# Patient Record
Sex: Male | Born: 1945 | Race: White | Hispanic: No | Marital: Married | Smoking: Never smoker
Health system: Southern US, Community
[De-identification: ages and names within clinical notes are randomized; demographics above are authoritative.]

## PROBLEM LIST (undated history)

## (undated) DIAGNOSIS — K219 Gastro-esophageal reflux disease without esophagitis: Secondary | ICD-10-CM

## (undated) DIAGNOSIS — Z950 Presence of cardiac pacemaker: Secondary | ICD-10-CM

## (undated) DIAGNOSIS — J189 Pneumonia, unspecified organism: Secondary | ICD-10-CM

## (undated) DIAGNOSIS — E7800 Pure hypercholesterolemia, unspecified: Secondary | ICD-10-CM

## (undated) DIAGNOSIS — M1990 Unspecified osteoarthritis, unspecified site: Secondary | ICD-10-CM

## (undated) DIAGNOSIS — F4310 Post-traumatic stress disorder, unspecified: Secondary | ICD-10-CM

## (undated) DIAGNOSIS — Z9289 Personal history of other medical treatment: Secondary | ICD-10-CM

## (undated) DIAGNOSIS — I4891 Unspecified atrial fibrillation: Secondary | ICD-10-CM

## (undated) DIAGNOSIS — Z9889 Other specified postprocedural states: Secondary | ICD-10-CM

## (undated) DIAGNOSIS — I2510 Atherosclerotic heart disease of native coronary artery without angina pectoris: Secondary | ICD-10-CM

## (undated) DIAGNOSIS — I499 Cardiac arrhythmia, unspecified: Secondary | ICD-10-CM

## (undated) DIAGNOSIS — R7303 Prediabetes: Secondary | ICD-10-CM

## (undated) DIAGNOSIS — Z95810 Presence of automatic (implantable) cardiac defibrillator: Secondary | ICD-10-CM

## (undated) DIAGNOSIS — I10 Essential (primary) hypertension: Secondary | ICD-10-CM

## (undated) DIAGNOSIS — I509 Heart failure, unspecified: Secondary | ICD-10-CM

## (undated) HISTORY — PX: CARDIAC DEFIBRILLATOR PLACEMENT: SHX171

## (undated) HISTORY — PX: PACEMAKER IMPLANT: EP1218

---

## 2008-07-04 ENCOUNTER — Ambulatory Visit: Payer: Self-pay | Admitting: Cardiology

## 2008-09-25 ENCOUNTER — Emergency Department: Payer: Self-pay | Admitting: Emergency Medicine

## 2011-08-28 DIAGNOSIS — I495 Sick sinus syndrome: Secondary | ICD-10-CM | POA: Diagnosis not present

## 2011-10-29 DIAGNOSIS — B029 Zoster without complications: Secondary | ICD-10-CM | POA: Diagnosis not present

## 2011-10-29 DIAGNOSIS — R109 Unspecified abdominal pain: Secondary | ICD-10-CM | POA: Diagnosis not present

## 2011-10-29 DIAGNOSIS — M549 Dorsalgia, unspecified: Secondary | ICD-10-CM | POA: Diagnosis not present

## 2011-10-29 DIAGNOSIS — R1084 Generalized abdominal pain: Secondary | ICD-10-CM | POA: Diagnosis not present

## 2012-02-16 DIAGNOSIS — I495 Sick sinus syndrome: Secondary | ICD-10-CM | POA: Diagnosis not present

## 2012-03-02 DIAGNOSIS — Z23 Encounter for immunization: Secondary | ICD-10-CM | POA: Diagnosis not present

## 2012-05-04 DIAGNOSIS — I421 Obstructive hypertrophic cardiomyopathy: Secondary | ICD-10-CM | POA: Diagnosis not present

## 2012-05-04 DIAGNOSIS — I2510 Atherosclerotic heart disease of native coronary artery without angina pectoris: Secondary | ICD-10-CM | POA: Diagnosis not present

## 2012-05-18 DIAGNOSIS — I495 Sick sinus syndrome: Secondary | ICD-10-CM | POA: Diagnosis not present

## 2012-07-09 DIAGNOSIS — E785 Hyperlipidemia, unspecified: Secondary | ICD-10-CM | POA: Diagnosis not present

## 2012-07-09 DIAGNOSIS — K219 Gastro-esophageal reflux disease without esophagitis: Secondary | ICD-10-CM | POA: Diagnosis not present

## 2012-07-09 DIAGNOSIS — F411 Generalized anxiety disorder: Secondary | ICD-10-CM | POA: Diagnosis not present

## 2012-08-16 DIAGNOSIS — I495 Sick sinus syndrome: Secondary | ICD-10-CM | POA: Diagnosis not present

## 2012-08-18 DIAGNOSIS — Z7901 Long term (current) use of anticoagulants: Secondary | ICD-10-CM | POA: Diagnosis not present

## 2012-08-18 DIAGNOSIS — I421 Obstructive hypertrophic cardiomyopathy: Secondary | ICD-10-CM | POA: Diagnosis not present

## 2012-08-18 DIAGNOSIS — I495 Sick sinus syndrome: Secondary | ICD-10-CM | POA: Diagnosis not present

## 2012-08-18 DIAGNOSIS — Z45018 Encounter for adjustment and management of other part of cardiac pacemaker: Secondary | ICD-10-CM | POA: Diagnosis not present

## 2012-08-18 DIAGNOSIS — I4891 Unspecified atrial fibrillation: Secondary | ICD-10-CM | POA: Diagnosis not present

## 2012-09-02 DIAGNOSIS — Z45018 Encounter for adjustment and management of other part of cardiac pacemaker: Secondary | ICD-10-CM | POA: Diagnosis not present

## 2012-09-02 DIAGNOSIS — I495 Sick sinus syndrome: Secondary | ICD-10-CM | POA: Diagnosis not present

## 2012-09-21 DIAGNOSIS — I4891 Unspecified atrial fibrillation: Secondary | ICD-10-CM | POA: Diagnosis not present

## 2012-09-21 DIAGNOSIS — K219 Gastro-esophageal reflux disease without esophagitis: Secondary | ICD-10-CM | POA: Diagnosis not present

## 2012-09-21 DIAGNOSIS — F411 Generalized anxiety disorder: Secondary | ICD-10-CM | POA: Diagnosis not present

## 2012-09-21 DIAGNOSIS — E785 Hyperlipidemia, unspecified: Secondary | ICD-10-CM | POA: Diagnosis not present

## 2012-09-21 DIAGNOSIS — R5381 Other malaise: Secondary | ICD-10-CM | POA: Diagnosis not present

## 2012-09-22 DIAGNOSIS — E785 Hyperlipidemia, unspecified: Secondary | ICD-10-CM | POA: Diagnosis not present

## 2012-09-22 DIAGNOSIS — I4891 Unspecified atrial fibrillation: Secondary | ICD-10-CM | POA: Diagnosis not present

## 2012-09-22 DIAGNOSIS — K219 Gastro-esophageal reflux disease without esophagitis: Secondary | ICD-10-CM | POA: Diagnosis not present

## 2012-09-22 DIAGNOSIS — F411 Generalized anxiety disorder: Secondary | ICD-10-CM | POA: Diagnosis not present

## 2012-09-22 DIAGNOSIS — R5383 Other fatigue: Secondary | ICD-10-CM | POA: Diagnosis not present

## 2012-09-24 DIAGNOSIS — I4891 Unspecified atrial fibrillation: Secondary | ICD-10-CM | POA: Diagnosis not present

## 2012-09-24 DIAGNOSIS — R079 Chest pain, unspecified: Secondary | ICD-10-CM | POA: Diagnosis not present

## 2012-09-24 DIAGNOSIS — I421 Obstructive hypertrophic cardiomyopathy: Secondary | ICD-10-CM | POA: Diagnosis not present

## 2012-09-24 DIAGNOSIS — I10 Essential (primary) hypertension: Secondary | ICD-10-CM | POA: Diagnosis not present

## 2012-09-24 DIAGNOSIS — Z45018 Encounter for adjustment and management of other part of cardiac pacemaker: Secondary | ICD-10-CM | POA: Diagnosis not present

## 2012-10-07 DIAGNOSIS — Z45018 Encounter for adjustment and management of other part of cardiac pacemaker: Secondary | ICD-10-CM | POA: Diagnosis not present

## 2012-10-07 DIAGNOSIS — I10 Essential (primary) hypertension: Secondary | ICD-10-CM | POA: Diagnosis not present

## 2012-10-07 DIAGNOSIS — I4891 Unspecified atrial fibrillation: Secondary | ICD-10-CM | POA: Diagnosis not present

## 2012-10-07 DIAGNOSIS — I421 Obstructive hypertrophic cardiomyopathy: Secondary | ICD-10-CM | POA: Diagnosis not present

## 2012-10-07 DIAGNOSIS — R079 Chest pain, unspecified: Secondary | ICD-10-CM | POA: Diagnosis not present

## 2012-10-13 DIAGNOSIS — Z950 Presence of cardiac pacemaker: Secondary | ICD-10-CM | POA: Diagnosis not present

## 2012-10-13 DIAGNOSIS — I421 Obstructive hypertrophic cardiomyopathy: Secondary | ICD-10-CM | POA: Diagnosis not present

## 2012-10-13 DIAGNOSIS — Z880 Allergy status to penicillin: Secondary | ICD-10-CM | POA: Diagnosis not present

## 2012-10-13 DIAGNOSIS — Z01818 Encounter for other preprocedural examination: Secondary | ICD-10-CM | POA: Diagnosis not present

## 2012-10-13 DIAGNOSIS — E785 Hyperlipidemia, unspecified: Secondary | ICD-10-CM | POA: Diagnosis not present

## 2012-10-13 DIAGNOSIS — I4891 Unspecified atrial fibrillation: Secondary | ICD-10-CM | POA: Diagnosis not present

## 2012-10-13 DIAGNOSIS — R918 Other nonspecific abnormal finding of lung field: Secondary | ICD-10-CM | POA: Diagnosis not present

## 2012-10-13 DIAGNOSIS — Z8249 Family history of ischemic heart disease and other diseases of the circulatory system: Secondary | ICD-10-CM | POA: Diagnosis not present

## 2012-10-13 DIAGNOSIS — I4510 Unspecified right bundle-branch block: Secondary | ICD-10-CM | POA: Diagnosis not present

## 2012-10-13 DIAGNOSIS — I252 Old myocardial infarction: Secondary | ICD-10-CM | POA: Diagnosis not present

## 2012-10-13 DIAGNOSIS — Z7901 Long term (current) use of anticoagulants: Secondary | ICD-10-CM | POA: Diagnosis not present

## 2012-10-13 DIAGNOSIS — I10 Essential (primary) hypertension: Secondary | ICD-10-CM | POA: Diagnosis not present

## 2012-10-13 DIAGNOSIS — R079 Chest pain, unspecified: Secondary | ICD-10-CM | POA: Diagnosis not present

## 2012-10-13 DIAGNOSIS — I059 Rheumatic mitral valve disease, unspecified: Secondary | ICD-10-CM | POA: Diagnosis not present

## 2012-10-13 DIAGNOSIS — Z79899 Other long term (current) drug therapy: Secondary | ICD-10-CM | POA: Diagnosis not present

## 2013-01-24 DIAGNOSIS — I4891 Unspecified atrial fibrillation: Secondary | ICD-10-CM | POA: Diagnosis not present

## 2013-01-24 DIAGNOSIS — R5381 Other malaise: Secondary | ICD-10-CM | POA: Diagnosis not present

## 2013-01-24 DIAGNOSIS — E785 Hyperlipidemia, unspecified: Secondary | ICD-10-CM | POA: Diagnosis not present

## 2013-01-24 DIAGNOSIS — K219 Gastro-esophageal reflux disease without esophagitis: Secondary | ICD-10-CM | POA: Diagnosis not present

## 2013-01-24 DIAGNOSIS — F411 Generalized anxiety disorder: Secondary | ICD-10-CM | POA: Diagnosis not present

## 2013-01-31 DIAGNOSIS — E785 Hyperlipidemia, unspecified: Secondary | ICD-10-CM | POA: Diagnosis not present

## 2013-01-31 DIAGNOSIS — K219 Gastro-esophageal reflux disease without esophagitis: Secondary | ICD-10-CM | POA: Diagnosis not present

## 2013-01-31 DIAGNOSIS — D485 Neoplasm of uncertain behavior of skin: Secondary | ICD-10-CM | POA: Diagnosis not present

## 2013-01-31 DIAGNOSIS — F411 Generalized anxiety disorder: Secondary | ICD-10-CM | POA: Diagnosis not present

## 2013-01-31 DIAGNOSIS — I4891 Unspecified atrial fibrillation: Secondary | ICD-10-CM | POA: Diagnosis not present

## 2013-01-31 DIAGNOSIS — Z23 Encounter for immunization: Secondary | ICD-10-CM | POA: Diagnosis not present

## 2013-01-31 DIAGNOSIS — R5381 Other malaise: Secondary | ICD-10-CM | POA: Diagnosis not present

## 2013-02-02 DIAGNOSIS — L821 Other seborrheic keratosis: Secondary | ICD-10-CM | POA: Diagnosis not present

## 2013-02-02 DIAGNOSIS — D485 Neoplasm of uncertain behavior of skin: Secondary | ICD-10-CM | POA: Diagnosis not present

## 2013-02-02 DIAGNOSIS — L570 Actinic keratosis: Secondary | ICD-10-CM | POA: Diagnosis not present

## 2013-04-06 DIAGNOSIS — Z45018 Encounter for adjustment and management of other part of cardiac pacemaker: Secondary | ICD-10-CM | POA: Diagnosis not present

## 2013-04-06 DIAGNOSIS — I421 Obstructive hypertrophic cardiomyopathy: Secondary | ICD-10-CM | POA: Diagnosis not present

## 2013-04-11 DIAGNOSIS — R55 Syncope and collapse: Secondary | ICD-10-CM | POA: Diagnosis not present

## 2013-04-13 DIAGNOSIS — I472 Ventricular tachycardia: Secondary | ICD-10-CM | POA: Diagnosis not present

## 2013-04-13 DIAGNOSIS — R42 Dizziness and giddiness: Secondary | ICD-10-CM | POA: Diagnosis not present

## 2013-04-13 DIAGNOSIS — R55 Syncope and collapse: Secondary | ICD-10-CM | POA: Diagnosis not present

## 2013-04-19 DIAGNOSIS — I472 Ventricular tachycardia: Secondary | ICD-10-CM | POA: Diagnosis not present

## 2013-04-19 DIAGNOSIS — R42 Dizziness and giddiness: Secondary | ICD-10-CM | POA: Diagnosis not present

## 2013-04-19 DIAGNOSIS — R55 Syncope and collapse: Secondary | ICD-10-CM | POA: Diagnosis not present

## 2013-04-27 DIAGNOSIS — I421 Obstructive hypertrophic cardiomyopathy: Secondary | ICD-10-CM | POA: Diagnosis not present

## 2013-04-27 DIAGNOSIS — I10 Essential (primary) hypertension: Secondary | ICD-10-CM | POA: Diagnosis not present

## 2013-04-27 DIAGNOSIS — I4891 Unspecified atrial fibrillation: Secondary | ICD-10-CM | POA: Diagnosis not present

## 2013-04-27 DIAGNOSIS — I2510 Atherosclerotic heart disease of native coronary artery without angina pectoris: Secondary | ICD-10-CM | POA: Diagnosis not present

## 2013-04-27 DIAGNOSIS — I472 Ventricular tachycardia: Secondary | ICD-10-CM | POA: Diagnosis not present

## 2013-05-27 DIAGNOSIS — I479 Paroxysmal tachycardia, unspecified: Secondary | ICD-10-CM | POA: Diagnosis not present

## 2013-05-27 DIAGNOSIS — I10 Essential (primary) hypertension: Secondary | ICD-10-CM | POA: Diagnosis not present

## 2013-05-27 DIAGNOSIS — I4729 Other ventricular tachycardia: Secondary | ICD-10-CM | POA: Diagnosis not present

## 2013-05-27 DIAGNOSIS — I421 Obstructive hypertrophic cardiomyopathy: Secondary | ICD-10-CM | POA: Diagnosis not present

## 2013-05-27 DIAGNOSIS — Z45018 Encounter for adjustment and management of other part of cardiac pacemaker: Secondary | ICD-10-CM | POA: Diagnosis not present

## 2013-05-27 DIAGNOSIS — Z7901 Long term (current) use of anticoagulants: Secondary | ICD-10-CM | POA: Diagnosis not present

## 2013-05-27 DIAGNOSIS — I472 Ventricular tachycardia, unspecified: Secondary | ICD-10-CM | POA: Diagnosis not present

## 2013-05-27 DIAGNOSIS — I4891 Unspecified atrial fibrillation: Secondary | ICD-10-CM | POA: Diagnosis not present

## 2013-05-28 DIAGNOSIS — I4891 Unspecified atrial fibrillation: Secondary | ICD-10-CM | POA: Diagnosis not present

## 2013-05-28 DIAGNOSIS — J984 Other disorders of lung: Secondary | ICD-10-CM | POA: Diagnosis not present

## 2013-05-28 DIAGNOSIS — Z7901 Long term (current) use of anticoagulants: Secondary | ICD-10-CM | POA: Diagnosis not present

## 2013-05-28 DIAGNOSIS — I4729 Other ventricular tachycardia: Secondary | ICD-10-CM | POA: Diagnosis not present

## 2013-05-28 DIAGNOSIS — Z45018 Encounter for adjustment and management of other part of cardiac pacemaker: Secondary | ICD-10-CM | POA: Diagnosis not present

## 2013-05-28 DIAGNOSIS — I421 Obstructive hypertrophic cardiomyopathy: Secondary | ICD-10-CM | POA: Diagnosis not present

## 2013-05-28 DIAGNOSIS — I472 Ventricular tachycardia: Secondary | ICD-10-CM | POA: Diagnosis not present

## 2013-05-28 DIAGNOSIS — I10 Essential (primary) hypertension: Secondary | ICD-10-CM | POA: Diagnosis not present

## 2013-05-30 DIAGNOSIS — K219 Gastro-esophageal reflux disease without esophagitis: Secondary | ICD-10-CM | POA: Diagnosis not present

## 2013-05-30 DIAGNOSIS — E785 Hyperlipidemia, unspecified: Secondary | ICD-10-CM | POA: Diagnosis not present

## 2013-05-30 DIAGNOSIS — I4891 Unspecified atrial fibrillation: Secondary | ICD-10-CM | POA: Diagnosis not present

## 2013-06-06 DIAGNOSIS — F411 Generalized anxiety disorder: Secondary | ICD-10-CM | POA: Diagnosis not present

## 2013-06-06 DIAGNOSIS — R5381 Other malaise: Secondary | ICD-10-CM | POA: Diagnosis not present

## 2013-06-06 DIAGNOSIS — R5383 Other fatigue: Secondary | ICD-10-CM | POA: Diagnosis not present

## 2013-06-06 DIAGNOSIS — I4891 Unspecified atrial fibrillation: Secondary | ICD-10-CM | POA: Diagnosis not present

## 2013-06-06 DIAGNOSIS — K219 Gastro-esophageal reflux disease without esophagitis: Secondary | ICD-10-CM | POA: Diagnosis not present

## 2013-06-06 DIAGNOSIS — F321 Major depressive disorder, single episode, moderate: Secondary | ICD-10-CM | POA: Diagnosis not present

## 2013-06-06 DIAGNOSIS — Z23 Encounter for immunization: Secondary | ICD-10-CM | POA: Diagnosis not present

## 2013-06-06 DIAGNOSIS — E785 Hyperlipidemia, unspecified: Secondary | ICD-10-CM | POA: Diagnosis not present

## 2013-08-11 DIAGNOSIS — Z4502 Encounter for adjustment and management of automatic implantable cardiac defibrillator: Secondary | ICD-10-CM | POA: Diagnosis not present

## 2013-08-11 DIAGNOSIS — I472 Ventricular tachycardia: Secondary | ICD-10-CM | POA: Diagnosis not present

## 2013-08-11 DIAGNOSIS — I4729 Other ventricular tachycardia: Secondary | ICD-10-CM | POA: Diagnosis not present

## 2013-08-11 DIAGNOSIS — I421 Obstructive hypertrophic cardiomyopathy: Secondary | ICD-10-CM | POA: Diagnosis not present

## 2013-09-15 DIAGNOSIS — R5381 Other malaise: Secondary | ICD-10-CM | POA: Diagnosis not present

## 2013-09-15 DIAGNOSIS — E785 Hyperlipidemia, unspecified: Secondary | ICD-10-CM | POA: Diagnosis not present

## 2013-09-23 DIAGNOSIS — K625 Hemorrhage of anus and rectum: Secondary | ICD-10-CM | POA: Diagnosis not present

## 2013-09-23 DIAGNOSIS — R310 Gross hematuria: Secondary | ICD-10-CM | POA: Diagnosis not present

## 2013-09-23 DIAGNOSIS — F411 Generalized anxiety disorder: Secondary | ICD-10-CM | POA: Diagnosis not present

## 2013-09-23 DIAGNOSIS — K219 Gastro-esophageal reflux disease without esophagitis: Secondary | ICD-10-CM | POA: Diagnosis not present

## 2013-09-23 DIAGNOSIS — R5381 Other malaise: Secondary | ICD-10-CM | POA: Diagnosis not present

## 2013-09-23 DIAGNOSIS — E785 Hyperlipidemia, unspecified: Secondary | ICD-10-CM | POA: Diagnosis not present

## 2013-09-23 DIAGNOSIS — F321 Major depressive disorder, single episode, moderate: Secondary | ICD-10-CM | POA: Diagnosis not present

## 2013-09-23 DIAGNOSIS — I4891 Unspecified atrial fibrillation: Secondary | ICD-10-CM | POA: Diagnosis not present

## 2013-10-14 DIAGNOSIS — K602 Anal fissure, unspecified: Secondary | ICD-10-CM | POA: Diagnosis not present

## 2013-10-14 DIAGNOSIS — I4891 Unspecified atrial fibrillation: Secondary | ICD-10-CM | POA: Diagnosis not present

## 2013-11-02 DIAGNOSIS — I209 Angina pectoris, unspecified: Secondary | ICD-10-CM | POA: Diagnosis not present

## 2013-11-02 DIAGNOSIS — I421 Obstructive hypertrophic cardiomyopathy: Secondary | ICD-10-CM | POA: Diagnosis not present

## 2013-11-02 DIAGNOSIS — I10 Essential (primary) hypertension: Secondary | ICD-10-CM | POA: Diagnosis not present

## 2013-11-02 DIAGNOSIS — I4891 Unspecified atrial fibrillation: Secondary | ICD-10-CM | POA: Diagnosis not present

## 2013-11-18 ENCOUNTER — Encounter (HOSPITAL_COMMUNITY): Payer: Self-pay | Admitting: Emergency Medicine

## 2013-11-18 ENCOUNTER — Emergency Department (HOSPITAL_COMMUNITY)
Admission: EM | Admit: 2013-11-18 | Discharge: 2013-11-18 | Disposition: A | Discharge status: Home / Self Care | Payer: Medicare Other | Attending: Emergency Medicine | Admitting: Emergency Medicine

## 2013-11-18 DIAGNOSIS — I2510 Atherosclerotic heart disease of native coronary artery without angina pectoris: Secondary | ICD-10-CM | POA: Insufficient documentation

## 2013-11-18 DIAGNOSIS — S60222S Contusion of left hand, sequela: Secondary | ICD-10-CM

## 2013-11-18 DIAGNOSIS — I209 Angina pectoris, unspecified: Secondary | ICD-10-CM | POA: Diagnosis not present

## 2013-11-18 DIAGNOSIS — Z880 Allergy status to penicillin: Secondary | ICD-10-CM | POA: Diagnosis not present

## 2013-11-18 DIAGNOSIS — Z9889 Other specified postprocedural states: Secondary | ICD-10-CM

## 2013-11-18 DIAGNOSIS — S60229A Contusion of unspecified hand, initial encounter: Secondary | ICD-10-CM | POA: Diagnosis not present

## 2013-11-18 DIAGNOSIS — M7989 Other specified soft tissue disorders: Secondary | ICD-10-CM | POA: Insufficient documentation

## 2013-11-18 HISTORY — DX: Other specified postprocedural states: Z98.890

## 2013-11-18 HISTORY — DX: Atherosclerotic heart disease of native coronary artery without angina pectoris: I25.10

## 2013-11-18 NOTE — Discharge Instructions (Signed)
Keep hand elevated. Use cold compresses.  Follow up as needed

## 2013-11-18 NOTE — ED Notes (Signed)
There was an IV in my left hand and after I took the bandage off it started swelling per pt.

## 2013-11-18 NOTE — ED Provider Notes (Signed)
CSN: [id number]     Arrival date & time 11/18/13  2142 History   First MD Initiated Contact with Patient 11/18/13 2201    This chart was scribed for Maudry Diego, MD by Terressa Koyanagi, ED Scribe. This patient was seen in room APA08/APA08 and the patient's care was started at 10:03 PM.  Chief Complaint  Patient presents with  . Hand Problem   Patient is a 68 y.o. male presenting with general illness. The history is provided by the patient. No language interpreter was used.  Illness Location:  Swelling to left hand  Severity:  Mild Onset quality:  Sudden Timing:  Constant Progression:  Unchanged Chronicity:  New Context:  Swelling at IV placement site  Associated symptoms: no abdominal pain, no chest pain, no congestion, no cough, no diarrhea, no fatigue, no headaches and no rash    HPI Comments: Walter Raymond is a 68 y.o. male who presents to the Emergency Department complaining of swelling to the left hand in the IV site. Pt reports that he had an IV placement in the same area and when he took the bandage off the area began to swell. Pt denies taking blood thinners including aspirin.    Past Medical History  Diagnosis Date  . S/P cardiac cath 11/18/2013  . Coronary artery disease    History reviewed. No pertinent past surgical history. No family history on file. History  Substance Use Topics  . Smoking status: Never Smoker   . Smokeless tobacco: Not on file  . Alcohol Use: Yes    Review of Systems  Constitutional: Negative for appetite change and fatigue.  HENT: Negative for congestion, ear discharge and sinus pressure.   Eyes: Negative for discharge.  Respiratory: Negative for cough.   Cardiovascular: Negative for chest pain.  Gastrointestinal: Negative for abdominal pain and diarrhea.  Genitourinary: Negative for frequency and hematuria.  Musculoskeletal: Negative for back pain.  Skin: Negative for rash.       Swelling to left hand   Neurological: Negative for  seizures and headaches.  Psychiatric/Behavioral: Negative for hallucinations.      Allergies  Penicillins  Home Medications   Prior to Admission medications   Not on File   Triage Vitals: BP 152/102  Pulse 85  Temp(Src) 98 F (36.7 C) (Oral)  Resp 16  Ht 5\' 8"  (1.727 m)  Wt 183 lb (83.008 kg)  BMI 27.83 kg/m2  SpO2 98% Physical Exam  Constitutional: He is oriented to person, place, and time. He appears well-developed.  HENT:  Head: Normocephalic.  Eyes: Conjunctivae are normal.  Neck: No tracheal deviation present.  Cardiovascular:  No murmur heard. Musculoskeletal: Normal range of motion. He exhibits tenderness (mild tenderness to left hand ).  Neurological: He is oriented to person, place, and time.  Skin: Skin is warm.  Mild bruising to posterior left hand, 2cm indurated  Psychiatric: He has a normal mood and affect.    ED Course  Procedures (including critical care time) DIAGNOSTIC STUDIES: Oxygen Saturation is 98% on RA, normal by my interpretation.    COORDINATION OF CARE: 10:07 PM-Discussed treatment plan which includes icing the affected area with pt at bedside and pt agreed to plan.    Labs Review Labs Reviewed - No data to display  Imaging Review No results found.   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  Cold compresses    Maudry Diego, MD  11/18/13 2255 

## 2013-11-18 NOTE — ED Notes (Signed)
Patient states he has a pacer/defib. Patient's heart rate dropped down to 45 during triage.

## 2013-12-12 DIAGNOSIS — Z4502 Encounter for adjustment and management of automatic implantable cardiac defibrillator: Secondary | ICD-10-CM | POA: Diagnosis not present

## 2013-12-12 DIAGNOSIS — I421 Obstructive hypertrophic cardiomyopathy: Secondary | ICD-10-CM | POA: Diagnosis not present

## 2013-12-13 DIAGNOSIS — I421 Obstructive hypertrophic cardiomyopathy: Secondary | ICD-10-CM | POA: Diagnosis not present

## 2013-12-13 DIAGNOSIS — I4891 Unspecified atrial fibrillation: Secondary | ICD-10-CM | POA: Diagnosis not present

## 2013-12-13 DIAGNOSIS — I10 Essential (primary) hypertension: Secondary | ICD-10-CM | POA: Diagnosis not present

## 2013-12-13 DIAGNOSIS — R0602 Shortness of breath: Secondary | ICD-10-CM | POA: Diagnosis not present

## 2013-12-13 DIAGNOSIS — I472 Ventricular tachycardia: Secondary | ICD-10-CM | POA: Diagnosis not present

## 2013-12-13 DIAGNOSIS — I209 Angina pectoris, unspecified: Secondary | ICD-10-CM | POA: Diagnosis not present

## 2013-12-13 DIAGNOSIS — I4729 Other ventricular tachycardia: Secondary | ICD-10-CM | POA: Diagnosis not present

## 2013-12-15 DIAGNOSIS — F3289 Other specified depressive episodes: Secondary | ICD-10-CM | POA: Diagnosis not present

## 2013-12-15 DIAGNOSIS — F329 Major depressive disorder, single episode, unspecified: Secondary | ICD-10-CM | POA: Diagnosis not present

## 2013-12-15 DIAGNOSIS — F411 Generalized anxiety disorder: Secondary | ICD-10-CM | POA: Diagnosis not present

## 2013-12-15 DIAGNOSIS — Z1211 Encounter for screening for malignant neoplasm of colon: Secondary | ICD-10-CM | POA: Diagnosis not present

## 2013-12-15 DIAGNOSIS — Z880 Allergy status to penicillin: Secondary | ICD-10-CM | POA: Diagnosis not present

## 2013-12-15 DIAGNOSIS — Z95810 Presence of automatic (implantable) cardiac defibrillator: Secondary | ICD-10-CM | POA: Diagnosis not present

## 2013-12-15 DIAGNOSIS — K602 Anal fissure, unspecified: Secondary | ICD-10-CM | POA: Diagnosis not present

## 2013-12-15 DIAGNOSIS — K5730 Diverticulosis of large intestine without perforation or abscess without bleeding: Secondary | ICD-10-CM | POA: Diagnosis not present

## 2013-12-15 DIAGNOSIS — Z79899 Other long term (current) drug therapy: Secondary | ICD-10-CM | POA: Diagnosis not present

## 2013-12-15 DIAGNOSIS — E785 Hyperlipidemia, unspecified: Secondary | ICD-10-CM | POA: Diagnosis not present

## 2013-12-15 DIAGNOSIS — I2510 Atherosclerotic heart disease of native coronary artery without angina pectoris: Secondary | ICD-10-CM | POA: Diagnosis not present

## 2013-12-15 DIAGNOSIS — M129 Arthropathy, unspecified: Secondary | ICD-10-CM | POA: Diagnosis not present

## 2013-12-15 DIAGNOSIS — Z7901 Long term (current) use of anticoagulants: Secondary | ICD-10-CM | POA: Diagnosis not present

## 2013-12-15 DIAGNOSIS — R011 Cardiac murmur, unspecified: Secondary | ICD-10-CM | POA: Diagnosis not present

## 2013-12-15 DIAGNOSIS — Z791 Long term (current) use of non-steroidal anti-inflammatories (NSAID): Secondary | ICD-10-CM | POA: Diagnosis not present

## 2013-12-15 DIAGNOSIS — K219 Gastro-esophageal reflux disease without esophagitis: Secondary | ICD-10-CM | POA: Diagnosis not present

## 2013-12-19 DIAGNOSIS — K219 Gastro-esophageal reflux disease without esophagitis: Secondary | ICD-10-CM | POA: Diagnosis not present

## 2014-01-10 DIAGNOSIS — F411 Generalized anxiety disorder: Secondary | ICD-10-CM | POA: Diagnosis not present

## 2014-01-10 DIAGNOSIS — K449 Diaphragmatic hernia without obstruction or gangrene: Secondary | ICD-10-CM | POA: Diagnosis not present

## 2014-01-10 DIAGNOSIS — Z880 Allergy status to penicillin: Secondary | ICD-10-CM | POA: Diagnosis not present

## 2014-01-10 DIAGNOSIS — Z800 Family history of malignant neoplasm of digestive organs: Secondary | ICD-10-CM | POA: Diagnosis not present

## 2014-01-10 DIAGNOSIS — K219 Gastro-esophageal reflux disease without esophagitis: Secondary | ICD-10-CM | POA: Diagnosis not present

## 2014-01-10 DIAGNOSIS — F3289 Other specified depressive episodes: Secondary | ICD-10-CM | POA: Diagnosis not present

## 2014-01-10 DIAGNOSIS — E785 Hyperlipidemia, unspecified: Secondary | ICD-10-CM | POA: Diagnosis not present

## 2014-01-10 DIAGNOSIS — Z8249 Family history of ischemic heart disease and other diseases of the circulatory system: Secondary | ICD-10-CM | POA: Diagnosis not present

## 2014-01-10 DIAGNOSIS — M129 Arthropathy, unspecified: Secondary | ICD-10-CM | POA: Diagnosis not present

## 2014-01-10 DIAGNOSIS — Z836 Family history of other diseases of the respiratory system: Secondary | ICD-10-CM | POA: Diagnosis not present

## 2014-01-10 DIAGNOSIS — Z79899 Other long term (current) drug therapy: Secondary | ICD-10-CM | POA: Diagnosis not present

## 2014-01-10 DIAGNOSIS — R1310 Dysphagia, unspecified: Secondary | ICD-10-CM | POA: Diagnosis not present

## 2014-01-10 DIAGNOSIS — F329 Major depressive disorder, single episode, unspecified: Secondary | ICD-10-CM | POA: Diagnosis not present

## 2014-01-10 DIAGNOSIS — I2510 Atherosclerotic heart disease of native coronary artery without angina pectoris: Secondary | ICD-10-CM | POA: Diagnosis not present

## 2014-01-10 DIAGNOSIS — K2970 Gastritis, unspecified, without bleeding: Secondary | ICD-10-CM | POA: Diagnosis not present

## 2014-01-10 DIAGNOSIS — K2940 Chronic atrophic gastritis without bleeding: Secondary | ICD-10-CM | POA: Diagnosis not present

## 2014-01-10 DIAGNOSIS — I4891 Unspecified atrial fibrillation: Secondary | ICD-10-CM | POA: Diagnosis not present

## 2014-01-10 DIAGNOSIS — R011 Cardiac murmur, unspecified: Secondary | ICD-10-CM | POA: Diagnosis not present

## 2014-01-10 DIAGNOSIS — K2990 Gastroduodenitis, unspecified, without bleeding: Secondary | ICD-10-CM | POA: Diagnosis not present

## 2014-01-10 DIAGNOSIS — Z95810 Presence of automatic (implantable) cardiac defibrillator: Secondary | ICD-10-CM | POA: Diagnosis not present

## 2014-01-20 DIAGNOSIS — E785 Hyperlipidemia, unspecified: Secondary | ICD-10-CM | POA: Diagnosis not present

## 2014-01-20 DIAGNOSIS — I4891 Unspecified atrial fibrillation: Secondary | ICD-10-CM | POA: Diagnosis not present

## 2014-01-20 DIAGNOSIS — R5381 Other malaise: Secondary | ICD-10-CM | POA: Diagnosis not present

## 2014-01-27 DIAGNOSIS — R634 Abnormal weight loss: Secondary | ICD-10-CM | POA: Diagnosis not present

## 2014-01-27 DIAGNOSIS — F321 Major depressive disorder, single episode, moderate: Secondary | ICD-10-CM | POA: Diagnosis not present

## 2014-01-27 DIAGNOSIS — K219 Gastro-esophageal reflux disease without esophagitis: Secondary | ICD-10-CM | POA: Diagnosis not present

## 2014-01-27 DIAGNOSIS — I4891 Unspecified atrial fibrillation: Secondary | ICD-10-CM | POA: Diagnosis not present

## 2014-01-27 DIAGNOSIS — R5381 Other malaise: Secondary | ICD-10-CM | POA: Diagnosis not present

## 2014-01-27 DIAGNOSIS — Z23 Encounter for immunization: Secondary | ICD-10-CM | POA: Diagnosis not present

## 2014-01-27 DIAGNOSIS — F411 Generalized anxiety disorder: Secondary | ICD-10-CM | POA: Diagnosis not present

## 2014-01-27 DIAGNOSIS — R5383 Other fatigue: Secondary | ICD-10-CM | POA: Diagnosis not present

## 2014-01-27 DIAGNOSIS — E785 Hyperlipidemia, unspecified: Secondary | ICD-10-CM | POA: Diagnosis not present

## 2014-02-04 DIAGNOSIS — N400 Enlarged prostate without lower urinary tract symptoms: Secondary | ICD-10-CM | POA: Diagnosis not present

## 2014-02-09 DIAGNOSIS — I472 Ventricular tachycardia: Secondary | ICD-10-CM | POA: Diagnosis not present

## 2014-02-09 DIAGNOSIS — I480 Paroxysmal atrial fibrillation: Secondary | ICD-10-CM | POA: Diagnosis not present

## 2014-02-09 DIAGNOSIS — I421 Obstructive hypertrophic cardiomyopathy: Secondary | ICD-10-CM | POA: Diagnosis not present

## 2014-02-09 DIAGNOSIS — I10 Essential (primary) hypertension: Secondary | ICD-10-CM | POA: Diagnosis not present

## 2014-02-09 DIAGNOSIS — I208 Other forms of angina pectoris: Secondary | ICD-10-CM | POA: Diagnosis not present

## 2014-02-09 DIAGNOSIS — K222 Esophageal obstruction: Secondary | ICD-10-CM | POA: Diagnosis not present

## 2014-02-20 DIAGNOSIS — R109 Unspecified abdominal pain: Secondary | ICD-10-CM | POA: Diagnosis not present

## 2014-02-20 DIAGNOSIS — I208 Other forms of angina pectoris: Secondary | ICD-10-CM | POA: Diagnosis not present

## 2014-03-08 DIAGNOSIS — N200 Calculus of kidney: Secondary | ICD-10-CM | POA: Diagnosis not present

## 2014-03-08 DIAGNOSIS — K5790 Diverticulosis of intestine, part unspecified, without perforation or abscess without bleeding: Secondary | ICD-10-CM | POA: Diagnosis not present

## 2014-03-08 DIAGNOSIS — K7689 Other specified diseases of liver: Secondary | ICD-10-CM | POA: Diagnosis not present

## 2014-03-21 DIAGNOSIS — I421 Obstructive hypertrophic cardiomyopathy: Secondary | ICD-10-CM | POA: Diagnosis not present

## 2014-03-21 DIAGNOSIS — Z4502 Encounter for adjustment and management of automatic implantable cardiac defibrillator: Secondary | ICD-10-CM | POA: Diagnosis not present

## 2014-04-18 DIAGNOSIS — F4312 Post-traumatic stress disorder, chronic: Secondary | ICD-10-CM | POA: Diagnosis not present

## 2014-04-25 DIAGNOSIS — I208 Other forms of angina pectoris: Secondary | ICD-10-CM | POA: Diagnosis not present

## 2014-04-25 DIAGNOSIS — R109 Unspecified abdominal pain: Secondary | ICD-10-CM | POA: Diagnosis not present

## 2014-05-18 DIAGNOSIS — F4312 Post-traumatic stress disorder, chronic: Secondary | ICD-10-CM | POA: Diagnosis not present

## 2014-06-06 DIAGNOSIS — B029 Zoster without complications: Secondary | ICD-10-CM | POA: Diagnosis not present

## 2014-06-22 DIAGNOSIS — F4312 Post-traumatic stress disorder, chronic: Secondary | ICD-10-CM | POA: Diagnosis not present

## 2014-07-24 DIAGNOSIS — E785 Hyperlipidemia, unspecified: Secondary | ICD-10-CM | POA: Diagnosis not present

## 2014-07-24 DIAGNOSIS — K219 Gastro-esophageal reflux disease without esophagitis: Secondary | ICD-10-CM | POA: Diagnosis not present

## 2014-07-24 DIAGNOSIS — R5382 Chronic fatigue, unspecified: Secondary | ICD-10-CM | POA: Diagnosis not present

## 2014-07-31 DIAGNOSIS — E782 Mixed hyperlipidemia: Secondary | ICD-10-CM | POA: Diagnosis not present

## 2014-07-31 DIAGNOSIS — I481 Persistent atrial fibrillation: Secondary | ICD-10-CM | POA: Diagnosis not present

## 2014-07-31 DIAGNOSIS — F324 Major depressive disorder, single episode, in partial remission: Secondary | ICD-10-CM | POA: Diagnosis not present

## 2014-07-31 DIAGNOSIS — F411 Generalized anxiety disorder: Secondary | ICD-10-CM | POA: Diagnosis not present

## 2014-07-31 DIAGNOSIS — G5602 Carpal tunnel syndrome, left upper limb: Secondary | ICD-10-CM | POA: Diagnosis not present

## 2014-07-31 DIAGNOSIS — K219 Gastro-esophageal reflux disease without esophagitis: Secondary | ICD-10-CM | POA: Diagnosis not present

## 2014-07-31 DIAGNOSIS — R634 Abnormal weight loss: Secondary | ICD-10-CM | POA: Diagnosis not present

## 2014-07-31 DIAGNOSIS — Z1389 Encounter for screening for other disorder: Secondary | ICD-10-CM | POA: Diagnosis not present

## 2014-07-31 DIAGNOSIS — G5601 Carpal tunnel syndrome, right upper limb: Secondary | ICD-10-CM | POA: Diagnosis not present

## 2014-08-01 DIAGNOSIS — A048 Other specified bacterial intestinal infections: Secondary | ICD-10-CM | POA: Diagnosis not present

## 2014-08-01 DIAGNOSIS — R1012 Left upper quadrant pain: Secondary | ICD-10-CM | POA: Diagnosis not present

## 2014-08-09 DIAGNOSIS — F4312 Post-traumatic stress disorder, chronic: Secondary | ICD-10-CM | POA: Diagnosis not present

## 2014-08-28 DIAGNOSIS — F4312 Post-traumatic stress disorder, chronic: Secondary | ICD-10-CM | POA: Diagnosis not present

## 2014-10-05 DIAGNOSIS — F4312 Post-traumatic stress disorder, chronic: Secondary | ICD-10-CM | POA: Diagnosis not present

## 2014-12-20 DIAGNOSIS — B360 Pityriasis versicolor: Secondary | ICD-10-CM | POA: Diagnosis not present

## 2014-12-21 DIAGNOSIS — E782 Mixed hyperlipidemia: Secondary | ICD-10-CM | POA: Diagnosis not present

## 2014-12-21 DIAGNOSIS — K219 Gastro-esophageal reflux disease without esophagitis: Secondary | ICD-10-CM | POA: Diagnosis not present

## 2014-12-27 DIAGNOSIS — F4312 Post-traumatic stress disorder, chronic: Secondary | ICD-10-CM | POA: Diagnosis not present

## 2015-01-04 DIAGNOSIS — I481 Persistent atrial fibrillation: Secondary | ICD-10-CM | POA: Diagnosis not present

## 2015-01-04 DIAGNOSIS — K219 Gastro-esophageal reflux disease without esophagitis: Secondary | ICD-10-CM | POA: Diagnosis not present

## 2015-01-04 DIAGNOSIS — F411 Generalized anxiety disorder: Secondary | ICD-10-CM | POA: Diagnosis not present

## 2015-01-04 DIAGNOSIS — E782 Mixed hyperlipidemia: Secondary | ICD-10-CM | POA: Diagnosis not present

## 2015-01-04 DIAGNOSIS — R634 Abnormal weight loss: Secondary | ICD-10-CM | POA: Diagnosis not present

## 2015-01-04 DIAGNOSIS — F324 Major depressive disorder, single episode, in partial remission: Secondary | ICD-10-CM | POA: Diagnosis not present

## 2015-02-05 DIAGNOSIS — F4312 Post-traumatic stress disorder, chronic: Secondary | ICD-10-CM | POA: Diagnosis not present

## 2015-02-09 ENCOUNTER — Emergency Department (HOSPITAL_COMMUNITY): Payer: Medicare Other

## 2015-02-09 ENCOUNTER — Encounter (HOSPITAL_COMMUNITY): Payer: Self-pay | Admitting: Emergency Medicine

## 2015-02-09 ENCOUNTER — Emergency Department (HOSPITAL_COMMUNITY)
Admission: EM | Admit: 2015-02-09 | Discharge: 2015-02-09 | Disposition: A | Discharge status: Home / Self Care | Payer: Medicare Other | Attending: Emergency Medicine | Admitting: Emergency Medicine

## 2015-02-09 DIAGNOSIS — Z880 Allergy status to penicillin: Secondary | ICD-10-CM | POA: Insufficient documentation

## 2015-02-09 DIAGNOSIS — E780 Pure hypercholesterolemia: Secondary | ICD-10-CM | POA: Insufficient documentation

## 2015-02-09 DIAGNOSIS — N200 Calculus of kidney: Secondary | ICD-10-CM | POA: Diagnosis not present

## 2015-02-09 DIAGNOSIS — M62838 Other muscle spasm: Secondary | ICD-10-CM | POA: Insufficient documentation

## 2015-02-09 DIAGNOSIS — I2510 Atherosclerotic heart disease of native coronary artery without angina pectoris: Secondary | ICD-10-CM | POA: Insufficient documentation

## 2015-02-09 DIAGNOSIS — Z791 Long term (current) use of non-steroidal anti-inflammatories (NSAID): Secondary | ICD-10-CM | POA: Diagnosis not present

## 2015-02-09 DIAGNOSIS — Z79899 Other long term (current) drug therapy: Secondary | ICD-10-CM | POA: Insufficient documentation

## 2015-02-09 DIAGNOSIS — R109 Unspecified abdominal pain: Secondary | ICD-10-CM | POA: Diagnosis not present

## 2015-02-09 DIAGNOSIS — Z9889 Other specified postprocedural states: Secondary | ICD-10-CM | POA: Insufficient documentation

## 2015-02-09 DIAGNOSIS — R319 Hematuria, unspecified: Secondary | ICD-10-CM | POA: Diagnosis not present

## 2015-02-09 DIAGNOSIS — M545 Low back pain, unspecified: Secondary | ICD-10-CM

## 2015-02-09 DIAGNOSIS — Z8659 Personal history of other mental and behavioral disorders: Secondary | ICD-10-CM | POA: Insufficient documentation

## 2015-02-09 DIAGNOSIS — Z7901 Long term (current) use of anticoagulants: Secondary | ICD-10-CM | POA: Insufficient documentation

## 2015-02-09 DIAGNOSIS — Z95810 Presence of automatic (implantable) cardiac defibrillator: Secondary | ICD-10-CM | POA: Insufficient documentation

## 2015-02-09 HISTORY — DX: Post-traumatic stress disorder, unspecified: F43.10

## 2015-02-09 HISTORY — DX: Pure hypercholesterolemia, unspecified: E78.00

## 2015-02-09 LAB — BASIC METABOLIC PANEL
Anion gap: 10 (ref 5–15)
BUN: 24 mg/dL — ABNORMAL HIGH (ref 6–20)
CALCIUM: 9.4 mg/dL (ref 8.9–10.3)
CO2: 26 mmol/L (ref 22–32)
CREATININE: 0.98 mg/dL (ref 0.61–1.24)
Chloride: 103 mmol/L (ref 101–111)
GFR calc non Af Amer: >60 mL/min (ref >60)
Glucose, Bld: 101 mg/dL — ABNORMAL HIGH (ref 65–99)
Potassium: 4.1 mmol/L (ref 3.5–5.1)
SODIUM: 139 mmol/L (ref 135–145)

## 2015-02-09 LAB — CBC WITH DIFFERENTIAL/PLATELET
BASOS PCT: 0 %
Basophils Absolute: 0 10*3/uL (ref 0.0–0.1)
EOS ABS: 0.1 10*3/uL (ref 0.0–0.7)
Eosinophils Relative: 1 %
HCT: 45.2 % (ref 39.0–52.0)
Hemoglobin: 15.5 g/dL (ref 13.0–17.0)
Lymphocytes Relative: 27 %
Lymphs Abs: 2.6 10*3/uL (ref 0.7–4.0)
MCH: 31.6 pg (ref 26.0–34.0)
MCHC: 34.3 g/dL (ref 30.0–36.0)
MCV: 92.1 fL (ref 78.0–100.0)
MONO ABS: 1 10*3/uL (ref 0.1–1.0)
MONOS PCT: 10 %
NEUTROS PCT: 62 %
Neutro Abs: 6 10*3/uL (ref 1.7–7.7)
PLATELETS: 186 10*3/uL (ref 150–400)
RBC: 4.91 MIL/uL (ref 4.22–5.81)
RDW: 12.6 % (ref 11.5–15.5)
WBC: 9.8 10*3/uL (ref 4.0–10.5)

## 2015-02-09 LAB — URINE MICROSCOPIC-ADD ON

## 2015-02-09 LAB — URINALYSIS, ROUTINE W REFLEX MICROSCOPIC
BILIRUBIN URINE: NEGATIVE
Glucose, UA: NEGATIVE mg/dL
Ketones, ur: NEGATIVE mg/dL
Leukocytes, UA: NEGATIVE
Nitrite: NEGATIVE
PROTEIN: 30 mg/dL — AB
Specific Gravity, Urine: >1.03 — ABNORMAL HIGH (ref 1.005–1.030)
UROBILINOGEN UA: 0.2 mg/dL (ref 0.0–1.0)
pH: 6.5 (ref 5.0–8.0)

## 2015-02-09 MED ORDER — HYDROMORPHONE HCL 1 MG/ML IJ SOLN
1.0000 mg | Freq: Once | INTRAMUSCULAR | Status: AC
  Administered 2015-02-09: 1 mg via INTRAVENOUS
  Filled 2015-02-09: qty 1

## 2015-02-09 MED ORDER — SULFAMETHOXAZOLE-TRIMETHOPRIM 800-160 MG PO TABS: 1.0000 | ORAL_TABLET | Freq: Two times a day (BID) | ORAL | Status: AC

## 2015-02-09 MED ORDER — HYDROMORPHONE HCL 1 MG/ML IJ SOLN
INTRAMUSCULAR | Status: AC
  Filled 2015-02-09: qty 1

## 2015-02-09 MED ORDER — OXYCODONE-ACETAMINOPHEN 5-325 MG PO TABS
ORAL_TABLET | ORAL | Status: AC
  Filled 2015-02-09: qty 1

## 2015-02-09 MED ORDER — ONDANSETRON HCL 4 MG/2ML IJ SOLN
4.0000 mg | Freq: Once | INTRAMUSCULAR | Status: AC
  Administered 2015-02-09: 4 mg via INTRAVENOUS

## 2015-02-09 MED ORDER — OXYCODONE-ACETAMINOPHEN 5-325 MG PO TABS
1.0000 | ORAL_TABLET | Freq: Once | ORAL | Status: AC
  Administered 2015-02-09: 1 via ORAL

## 2015-02-09 MED ORDER — ONDANSETRON HCL 4 MG/2ML IJ SOLN
4.0000 mg | Freq: Once | INTRAMUSCULAR | Status: DC
  Filled 2015-02-09: qty 2

## 2015-02-09 MED ORDER — OXYCODONE-ACETAMINOPHEN 5-325 MG PO TABS: 1.0000 | ORAL_TABLET | ORAL | Status: DC | PRN

## 2015-02-09 MED ORDER — CYCLOBENZAPRINE HCL 5 MG PO TABS: 5.0000 mg | ORAL_TABLET | Freq: Three times a day (TID) | ORAL | Status: DC | PRN

## 2015-02-09 MED ORDER — HYDROMORPHONE HCL 1 MG/ML IJ SOLN
1.0000 mg | Freq: Once | INTRAMUSCULAR | Status: AC
  Administered 2015-02-09: 1 mg via INTRAVENOUS

## 2015-02-09 NOTE — ED Provider Notes (Signed)
Patient presented to the ER with back pain. Patient reports sharp, stabbing pain in the lower back that significant worsens with movement.  Face to face Exam: HEENT - PERRLA Lungs - CTAB Heart - RRR, no M/R/G Abd - S/NT/ND Neuro - alert, oriented x3  Plan: Workup reveals hematuria. CT scan did show evidence of a renal stone which might be causing the hematuria, but there is no obstruction. It does not expand the patient's pain. He will need follow-up with urology to follow this. Otherwise, pain is most consistent with musculoskeletal back pain. He has significant relief with analgesia, still has pain with movement of the back, consistent with spasms. He will require analgesia and rest as an outpatient, follow-up with PCP.  Orpah Greek, MD 02/09/15 [phone]

## 2015-02-09 NOTE — ED Notes (Signed)
Pt reports low back pain that started 2 days ago. Pt states pain is worse today. No known injury.

## 2015-02-09 NOTE — Discharge Instructions (Signed)
Back Pain, Adult Low back pain is very common. About 1 in 5 people have back pain.The cause of low back pain is rarely dangerous. The pain often gets better over time.About half of people with a sudden onset of back pain feel better in just 2 weeks. About 8 in 10 people feel better by 6 weeks.  CAUSES Some common causes of back pain include:  Strain of the muscles or ligaments supporting the spine.  Wear and tear (degeneration) of the spinal discs.  Arthritis.  Direct injury to the back. DIAGNOSIS Most of the time, the direct cause of low back pain is not known.However, back pain can be treated effectively even when the exact cause of the pain is unknown.Answering your caregiver's questions about your overall health and symptoms is one of the most accurate ways to make sure the cause of your pain is not dangerous. If your caregiver needs more information, he or she may order lab work or imaging tests (X-rays or MRIs).However, even if imaging tests show changes in your back, this usually does not require surgery. HOME CARE INSTRUCTIONS For many people, back pain returns.Since low back pain is rarely dangerous, it is often a condition that people can learn to [facility] their own.   Remain active. It is stressful on the back to sit or stand in one place. Do not sit, drive, or stand in one place for more than 30 minutes at a time. Take short walks on level surfaces as soon as pain allows.Try to increase the length of time you walk each day.  Do not stay in bed.Resting more than 1 or 2 days can delay your recovery.  Do not avoid exercise or work.Your body is made to move.It is not dangerous to be active, even though your back may hurt.Your back will likely heal faster if you return to being active before your pain is gone.  Pay attention to your body when you bend and lift. Many people have less discomfortwhen lifting if they bend their knees, keep the load close to their bodies,and  avoid twisting. Often, the most comfortable positions are those that put less stress on your recovering back.  Find a comfortable position to sleep. Use a firm mattress and lie on your side with your knees slightly bent. If you lie on your back, put a pillow under your knees.  Only take over-the-counter or prescription medicines as directed by your caregiver. Over-the-counter medicines to reduce pain and inflammation are often the most helpful.Your caregiver may prescribe muscle relaxant drugs.These medicines help dull your pain so you can more quickly return to your normal activities and healthy exercise.  Put ice on the injured area.  Put ice in a plastic bag.  Place a towel between your skin and the bag.  Leave the ice on for 15-20 minutes, 03-04 times a day for the first 2 to 3 days. After that, ice and heat may be alternated to reduce pain and spasms.  Ask your caregiver about trying back exercises and gentle massage. This may be of some benefit.  Avoid feeling anxious or stressed.Stress increases muscle tension and can worsen back pain.It is important to recognize when you are anxious or stressed and learn ways to manage it.Exercise is a great option. SEEK MEDICAL CARE IF:  You have pain that is not relieved with rest or medicine.  You have pain that does not improve in 1 week.  You have new symptoms.  You are generally not feeling well. SEEK  IMMEDIATE MEDICAL CARE IF:   You have pain that radiates from your back into your legs.  You develop new bowel or bladder control problems.  You have unusual weakness or numbness in your arms or legs.  You develop nausea or vomiting.  You develop abdominal pain.  You feel faint. Document Released: 04/28/2005 Document Revised: 10/28/2011 Document Reviewed: 08/30/2013 [facility] Patient Information 2015 [place], [place]. This information is not intended to replace advice given to you by your health care provider. Make sure you  discuss any questions you have with your health care provider.  Hematuria Hematuria is blood in your urine. It can be caused by a bladder infection, kidney infection, prostate infection, kidney stone, or cancer of your urinary tract. Infections can usually be treated with medicine, and a kidney stone usually will pass through your urine. If neither of these is the cause of your hematuria, further workup to find out the reason may be needed. It is very important that you tell your health care provider about any blood you see in your urine, even if the blood stops without treatment or happens without causing pain. Blood in your urine that happens and then stops and then happens again can be a symptom of a very serious condition. Also, pain is not a symptom in the initial stages of many urinary cancers. HOME CARE INSTRUCTIONS   Drink lots of fluid, 3-4 quarts a day. If you have been diagnosed with an infection, cranberry juice is especially recommended, in addition to large amounts of water.  Avoid caffeine, tea, and carbonated beverages because they tend to irritate the bladder.  Avoid alcohol because it may irritate the prostate.  Take all medicines as directed by your health care provider.  If you were prescribed an antibiotic medicine, finish it all even if you start to feel better.  If you have been diagnosed with a kidney stone, follow your health care provider's instructions regarding straining your urine to catch the stone.  Empty your bladder often. Avoid holding urine for long periods of time.  After a bowel movement, women should cleanse front to back. Use each tissue only once.  Empty your bladder before and after sexual intercourse if you are a male. SEEK MEDICAL CARE IF:  You develop back pain.  You have a fever.  You have a feeling of sickness in your stomach (nausea) or vomiting.  Your symptoms are not better in 3 days. Return sooner if you are getting worse. SEEK  IMMEDIATE MEDICAL CARE IF:   You develop severe vomiting and are unable to keep the medicine down.  You develop severe back or abdominal pain despite taking your medicines.  You begin passing a large amount of blood or clots in your urine.  You feel extremely weak or faint, or you pass out. MAKE SURE YOU:   Understand these instructions.  Will watch your condition.  Will get help right away if you are not doing well or get worse. Document Released: 04/28/2005 Document Revised: 09/12/2013 Document Reviewed: 12/27/2012 [facility] Patient Information 2015 [place], [place]. This information is not intended to replace advice given to you by your health care provider. Make sure you discuss any questions you have with your health care provider.   Do not drive within 4 hours of taking oxycodone as this will make you drowsy.  Avoid lifting,  Bending,  Twisting or any other activity that worsens your pain over the next week.  Apply an  icepack  to your lower back  for 10-15 minutes every 2 hours for the next 2 days.  You should get rechecked if your symptoms are not better over the next 5 days,  Or you develop increased pain,  Weakness in your leg(s) or loss of bladder or bowel function - these are symptoms of a worse injury.  You should also followup with urology as there was blood in your urine today which could be a complication from your kidney stone.  See the referral above.

## 2015-02-14 DIAGNOSIS — M9903 Segmental and somatic dysfunction of lumbar region: Secondary | ICD-10-CM | POA: Diagnosis not present

## 2015-02-14 DIAGNOSIS — M47816 Spondylosis without myelopathy or radiculopathy, lumbar region: Secondary | ICD-10-CM | POA: Diagnosis not present

## 2015-02-14 DIAGNOSIS — S336XXA Sprain of sacroiliac joint, initial encounter: Secondary | ICD-10-CM | POA: Diagnosis not present

## 2015-02-15 DIAGNOSIS — S336XXA Sprain of sacroiliac joint, initial encounter: Secondary | ICD-10-CM | POA: Diagnosis not present

## 2015-02-15 DIAGNOSIS — M47816 Spondylosis without myelopathy or radiculopathy, lumbar region: Secondary | ICD-10-CM | POA: Diagnosis not present

## 2015-02-15 DIAGNOSIS — M9903 Segmental and somatic dysfunction of lumbar region: Secondary | ICD-10-CM | POA: Diagnosis not present

## 2015-02-19 DIAGNOSIS — M9903 Segmental and somatic dysfunction of lumbar region: Secondary | ICD-10-CM | POA: Diagnosis not present

## 2015-02-19 DIAGNOSIS — S336XXA Sprain of sacroiliac joint, initial encounter: Secondary | ICD-10-CM | POA: Diagnosis not present

## 2015-02-19 DIAGNOSIS — M47816 Spondylosis without myelopathy or radiculopathy, lumbar region: Secondary | ICD-10-CM | POA: Diagnosis not present

## 2015-02-21 DIAGNOSIS — S336XXA Sprain of sacroiliac joint, initial encounter: Secondary | ICD-10-CM | POA: Diagnosis not present

## 2015-02-21 DIAGNOSIS — M9903 Segmental and somatic dysfunction of lumbar region: Secondary | ICD-10-CM | POA: Diagnosis not present

## 2015-02-21 DIAGNOSIS — M47816 Spondylosis without myelopathy or radiculopathy, lumbar region: Secondary | ICD-10-CM | POA: Diagnosis not present

## 2015-02-21 DIAGNOSIS — M545 Low back pain: Secondary | ICD-10-CM | POA: Diagnosis not present

## 2015-02-23 NOTE — ED Provider Notes (Signed)
CSN: [id number]     Arrival date & time 02/09/15  1346 History   First MD Initiated Contact with Patient 02/09/15 1401     Chief Complaint  Patient presents with  . Back Pain     (Consider location/radiation/quality/duration/timing/severity/associated sxs/prior Treatment) The history is provided by the patient.   Walter Raymond is a 69 y.o. male with significant past medical history as indicated below presenting with left lower back pain, worsened with movement and palpation, described as constant and sharp with a spasm component starting several days ago.  He denies any injury and has had no weakness or numbness in his legs, denies urinary or bowel incontinence or retention, no fevers, chills or flank pain.  He does endorse darker than normal urine, denies urinary frequency, urgency or hematuria.  He is on [place] for CAD.  Denies any excessive bruising or bleeding.  He has taken no medicines for pain.  Rest improves is pain.     Past Medical History  Diagnosis Date  . S/P cardiac cath 11/18/2013  . Coronary artery disease   . Hypercholesteremia   . PTSD (post-traumatic stress disorder)    Past Surgical History  Procedure Laterality Date  . Cardiac defibrillator placement    . Cardiac defibrillator placement     Family History  Problem Relation Age of Onset  . Heart failure Mother    Social History  Substance Use Topics  . Smoking status: Never Smoker   . Smokeless tobacco: Never Used  . Alcohol Use: 12.6 oz/week    21 Cans of beer per week     Comment: 2-3 a day    Review of Systems  Constitutional: Negative for fever.  Respiratory: Negative for shortness of breath.   Cardiovascular: Negative for chest pain and leg swelling.  Gastrointestinal: Negative for abdominal pain, constipation and abdominal distention.  Genitourinary: Negative for dysuria, urgency, frequency, flank pain and difficulty urinating.  Musculoskeletal: Positive for back pain. Negative for joint  swelling and gait problem.  Skin: Negative for rash.  Neurological: Negative for weakness and numbness.      Allergies  Penicillins  Home Medications   Prior to Admission medications   Medication Sig Start Date End Date Taking? Authorizing Provider  FLUoxetine (PROZAC) 40 MG capsule Take 40 mg by mouth daily. 12/14/13  Yes Historical Provider, MD  lisinopril (PRINIVIL,ZESTRIL) 10 MG tablet Take 10 mg by mouth daily. 02/09/14 02/09/15 Yes Historical Provider, MD  meloxicam (MOBIC) 7.5 MG tablet Take 7.5 mg by mouth every morning.   Yes Historical Provider, MD  nabumetone (RELAFEN) 500 MG tablet Take 500 mg by mouth as directed.   Yes Historical Provider, MD  ranitidine (ZANTAC) 150 MG tablet Take 150 mg by mouth at bedtime. 06/20/14  Yes Historical Provider, MD  simvastatin (ZOCOR) 40 MG tablet Take 40 mg by mouth at bedtime.   Yes Historical Provider, MD  sotalol (BETAPACE) 80 MG tablet Take 40 mg by mouth 2 (two) times daily. 01/26/15  Yes Historical Provider, MD  traZODone (DESYREL) 50 MG tablet Take 50 mg by mouth at bedtime. 01/18/15  Yes Historical Provider, MD  XARELTO 20 MG TABS tablet Take 20 mg by mouth daily. 01/26/15  Yes Historical Provider, MD  cyclobenzaprine (FLEXERIL) 5 MG tablet Take 1 tablet (5 mg total) by mouth 3 (three) times daily as needed for muscle spasms. 02/09/15   Evalee Jefferson, PA-C  oxyCODONE-acetaminophen (PERCOCET/ROXICET) 5-325 MG tablet Take 1 tablet by mouth every 4 (four) hours as needed.  02/09/15   Evalee Jefferson, PA-C   BP 125/83 mmHg  Pulse 67  Temp(Src) 98 F (36.7 C)  Resp 24  Ht 5\' 8"  (1.727 m)  Wt 175 lb (79.379 kg)  BMI 26.61 kg/m2  SpO2 98% Physical Exam  Constitutional: He appears well-developed and well-nourished.  HENT:  Head: Normocephalic.  Eyes: Conjunctivae are normal.  Neck: Normal range of motion. Neck supple.  Cardiovascular: Normal rate and intact distal pulses.   Pedal pulses normal.  Pulmonary/Chest: Effort normal.  Abdominal: Soft.  Bowel sounds are normal. He exhibits no distension and no mass.  Musculoskeletal: Normal range of motion. He exhibits no edema.       Right shoulder: He exhibits spasm.       Lumbar back: He exhibits tenderness. He exhibits no swelling, no edema and no spasm.  Bilateral paralumbar ttp, no edema, no palpable deformity,  left paralumbar spasm.  Neurological: He is alert. He has normal strength. He displays no atrophy and no tremor. No sensory deficit. Gait normal.  Reflex Scores:      Patellar reflexes are 2+ on the right side and 2+ on the left side.      Achilles reflexes are 2+ on the right side and 2+ on the left side. No strength deficit noted in hip and knee flexor and extensor muscle groups.  Ankle flexion and extension intact.  Skin: Skin is warm and dry.  Psychiatric: He has a normal mood and affect.  Nursing note and vitals reviewed.   ED Course  Procedures (including critical care time) Labs Review Labs Reviewed  BASIC METABOLIC PANEL - Abnormal; Notable for the following:    Glucose, Bld 101 (*)    BUN 24 (*)    All other components within normal limits  URINALYSIS, ROUTINE W REFLEX MICROSCOPIC (NOT AT [facility]) - Abnormal; Notable for the following:    Specific Gravity, Urine >1.030 (*)    Hgb urine dipstick SMALL (*)    Protein, ur 30 (*)    All other components within normal limits  URINE MICROSCOPIC-ADD ON - Abnormal; Notable for the following:    Squamous Epithelial / LPF FEW (*)    All other components within normal limits  CBC WITH DIFFERENTIAL/PLATELET    Imaging Review No results found. I have personally reviewed and evaluated these images and lab results as part of my medical decision-making.   EKG Interpretation   Date/Time:  Friday February 09 2015 13:55:34 EDT Ventricular Rate:  61 PR Interval:  286 QRS Duration: 136 QT Interval:  452 QTC Calculation: 455 R Axis:   -90 Text Interpretation:  Atrial-paced rhythm with prolonged AV conduction  with  occasional AV dual-paced complexes and Premature supraventricular  complexes Right bundle branch block T wave abnormality, consider lateral  ischemia Abnormal ECG No previous tracing Confirmed by POLLINA  MD,  CHRISTOPHER ([id number]) on 02/09/2015 4:47:46 PM      MDM   Final diagnoses:  Hematuria  Left-sided low back pain without sciatica    Patients  labs reviewed.  Radiological studies were viewed, interpreted and considered during the medical decision making and disposition process. I agree with radiologists reading.  Results were also discussed with patient.   Pain is reproducible with palpation and movement c/w musculoskeletal source.  He was placed on oxycodone, flexeril for pain, advised heat tx.  Also prescribed bactrim, given hematuria. Advised f/u with pcp prn, also referred to urology for further evaluation of hematuria.  Pt seen by Dr. Betsey Holiday during this  visit.  No neuro deficit on exam or by history to suggest emergent or surgical presentation.  Also discussed worsened sx that should prompt immediate re-evaluation including distal weakness, bowel/bladder retention/incontinence.           Evalee Jefferson, PA-C 02/24/15 2182  Orpah Greek, MD 02/24/15 [phone]

## 2015-02-26 DIAGNOSIS — S336XXA Sprain of sacroiliac joint, initial encounter: Secondary | ICD-10-CM | POA: Diagnosis not present

## 2015-02-26 DIAGNOSIS — S338XXA Sprain of other parts of lumbar spine and pelvis, initial encounter: Secondary | ICD-10-CM | POA: Diagnosis not present

## 2015-02-26 DIAGNOSIS — M47816 Spondylosis without myelopathy or radiculopathy, lumbar region: Secondary | ICD-10-CM | POA: Diagnosis not present

## 2015-02-26 DIAGNOSIS — M545 Low back pain: Secondary | ICD-10-CM | POA: Diagnosis not present

## 2015-02-26 DIAGNOSIS — M9903 Segmental and somatic dysfunction of lumbar region: Secondary | ICD-10-CM | POA: Diagnosis not present

## 2015-03-01 DIAGNOSIS — M47816 Spondylosis without myelopathy or radiculopathy, lumbar region: Secondary | ICD-10-CM | POA: Diagnosis not present

## 2015-03-01 DIAGNOSIS — S338XXA Sprain of other parts of lumbar spine and pelvis, initial encounter: Secondary | ICD-10-CM | POA: Diagnosis not present

## 2015-03-01 DIAGNOSIS — M545 Low back pain: Secondary | ICD-10-CM | POA: Diagnosis not present

## 2015-03-01 DIAGNOSIS — S336XXA Sprain of sacroiliac joint, initial encounter: Secondary | ICD-10-CM | POA: Diagnosis not present

## 2015-03-01 DIAGNOSIS — M9903 Segmental and somatic dysfunction of lumbar region: Secondary | ICD-10-CM | POA: Diagnosis not present

## 2015-03-05 DIAGNOSIS — S338XXA Sprain of other parts of lumbar spine and pelvis, initial encounter: Secondary | ICD-10-CM | POA: Diagnosis not present

## 2015-03-05 DIAGNOSIS — M545 Low back pain: Secondary | ICD-10-CM | POA: Diagnosis not present

## 2015-03-05 DIAGNOSIS — M9903 Segmental and somatic dysfunction of lumbar region: Secondary | ICD-10-CM | POA: Diagnosis not present

## 2015-03-05 DIAGNOSIS — S336XXA Sprain of sacroiliac joint, initial encounter: Secondary | ICD-10-CM | POA: Diagnosis not present

## 2015-03-05 DIAGNOSIS — M47816 Spondylosis without myelopathy or radiculopathy, lumbar region: Secondary | ICD-10-CM | POA: Diagnosis not present

## 2015-03-06 DIAGNOSIS — F4312 Post-traumatic stress disorder, chronic: Secondary | ICD-10-CM | POA: Diagnosis not present

## 2015-03-12 DIAGNOSIS — M9903 Segmental and somatic dysfunction of lumbar region: Secondary | ICD-10-CM | POA: Diagnosis not present

## 2015-03-12 DIAGNOSIS — M47816 Spondylosis without myelopathy or radiculopathy, lumbar region: Secondary | ICD-10-CM | POA: Diagnosis not present

## 2015-03-12 DIAGNOSIS — S338XXA Sprain of other parts of lumbar spine and pelvis, initial encounter: Secondary | ICD-10-CM | POA: Diagnosis not present

## 2015-03-12 DIAGNOSIS — S336XXA Sprain of sacroiliac joint, initial encounter: Secondary | ICD-10-CM | POA: Diagnosis not present

## 2015-03-12 DIAGNOSIS — M545 Low back pain: Secondary | ICD-10-CM | POA: Diagnosis not present

## 2015-03-16 DIAGNOSIS — H40033 Anatomical narrow angle, bilateral: Secondary | ICD-10-CM | POA: Diagnosis not present

## 2015-03-16 DIAGNOSIS — H04123 Dry eye syndrome of bilateral lacrimal glands: Secondary | ICD-10-CM | POA: Diagnosis not present

## 2015-03-19 DIAGNOSIS — M9903 Segmental and somatic dysfunction of lumbar region: Secondary | ICD-10-CM | POA: Diagnosis not present

## 2015-03-19 DIAGNOSIS — S336XXA Sprain of sacroiliac joint, initial encounter: Secondary | ICD-10-CM | POA: Diagnosis not present

## 2015-03-19 DIAGNOSIS — S338XXA Sprain of other parts of lumbar spine and pelvis, initial encounter: Secondary | ICD-10-CM | POA: Diagnosis not present

## 2015-03-19 DIAGNOSIS — M47816 Spondylosis without myelopathy or radiculopathy, lumbar region: Secondary | ICD-10-CM | POA: Diagnosis not present

## 2015-03-19 DIAGNOSIS — M9901 Segmental and somatic dysfunction of cervical region: Secondary | ICD-10-CM | POA: Diagnosis not present

## 2015-03-19 DIAGNOSIS — M9902 Segmental and somatic dysfunction of thoracic region: Secondary | ICD-10-CM | POA: Diagnosis not present

## 2015-03-19 DIAGNOSIS — M545 Low back pain: Secondary | ICD-10-CM | POA: Diagnosis not present

## 2015-04-02 DIAGNOSIS — M9902 Segmental and somatic dysfunction of thoracic region: Secondary | ICD-10-CM | POA: Diagnosis not present

## 2015-04-02 DIAGNOSIS — M47816 Spondylosis without myelopathy or radiculopathy, lumbar region: Secondary | ICD-10-CM | POA: Diagnosis not present

## 2015-04-02 DIAGNOSIS — M9903 Segmental and somatic dysfunction of lumbar region: Secondary | ICD-10-CM | POA: Diagnosis not present

## 2015-04-02 DIAGNOSIS — S336XXA Sprain of sacroiliac joint, initial encounter: Secondary | ICD-10-CM | POA: Diagnosis not present

## 2015-04-02 DIAGNOSIS — M545 Low back pain: Secondary | ICD-10-CM | POA: Diagnosis not present

## 2015-04-02 DIAGNOSIS — M9901 Segmental and somatic dysfunction of cervical region: Secondary | ICD-10-CM | POA: Diagnosis not present

## 2015-04-02 DIAGNOSIS — S338XXA Sprain of other parts of lumbar spine and pelvis, initial encounter: Secondary | ICD-10-CM | POA: Diagnosis not present

## 2015-04-03 DIAGNOSIS — F4312 Post-traumatic stress disorder, chronic: Secondary | ICD-10-CM | POA: Diagnosis not present

## 2015-04-18 DIAGNOSIS — M47816 Spondylosis without myelopathy or radiculopathy, lumbar region: Secondary | ICD-10-CM | POA: Diagnosis not present

## 2015-04-18 DIAGNOSIS — S338XXA Sprain of other parts of lumbar spine and pelvis, initial encounter: Secondary | ICD-10-CM | POA: Diagnosis not present

## 2015-04-18 DIAGNOSIS — M9902 Segmental and somatic dysfunction of thoracic region: Secondary | ICD-10-CM | POA: Diagnosis not present

## 2015-04-18 DIAGNOSIS — S336XXA Sprain of sacroiliac joint, initial encounter: Secondary | ICD-10-CM | POA: Diagnosis not present

## 2015-04-18 DIAGNOSIS — M9901 Segmental and somatic dysfunction of cervical region: Secondary | ICD-10-CM | POA: Diagnosis not present

## 2015-04-18 DIAGNOSIS — M545 Low back pain: Secondary | ICD-10-CM | POA: Diagnosis not present

## 2015-04-18 DIAGNOSIS — M9903 Segmental and somatic dysfunction of lumbar region: Secondary | ICD-10-CM | POA: Diagnosis not present

## 2015-04-23 DIAGNOSIS — M9902 Segmental and somatic dysfunction of thoracic region: Secondary | ICD-10-CM | POA: Diagnosis not present

## 2015-04-23 DIAGNOSIS — S336XXA Sprain of sacroiliac joint, initial encounter: Secondary | ICD-10-CM | POA: Diagnosis not present

## 2015-04-23 DIAGNOSIS — M9901 Segmental and somatic dysfunction of cervical region: Secondary | ICD-10-CM | POA: Diagnosis not present

## 2015-04-23 DIAGNOSIS — M9903 Segmental and somatic dysfunction of lumbar region: Secondary | ICD-10-CM | POA: Diagnosis not present

## 2015-04-23 DIAGNOSIS — F4312 Post-traumatic stress disorder, chronic: Secondary | ICD-10-CM | POA: Diagnosis not present

## 2015-04-23 DIAGNOSIS — S338XXA Sprain of other parts of lumbar spine and pelvis, initial encounter: Secondary | ICD-10-CM | POA: Diagnosis not present

## 2015-04-23 DIAGNOSIS — M47816 Spondylosis without myelopathy or radiculopathy, lumbar region: Secondary | ICD-10-CM | POA: Diagnosis not present

## 2015-04-23 DIAGNOSIS — M545 Low back pain: Secondary | ICD-10-CM | POA: Diagnosis not present

## 2015-05-28 DIAGNOSIS — F4312 Post-traumatic stress disorder, chronic: Secondary | ICD-10-CM | POA: Diagnosis not present

## 2015-06-05 DIAGNOSIS — M25561 Pain in right knee: Secondary | ICD-10-CM | POA: Diagnosis not present

## 2015-06-20 ENCOUNTER — Other Ambulatory Visit: Payer: Self-pay | Admitting: Orthopedic Surgery

## 2015-06-20 DIAGNOSIS — M545 Low back pain, unspecified: Secondary | ICD-10-CM

## 2015-06-20 DIAGNOSIS — M25561 Pain in right knee: Secondary | ICD-10-CM | POA: Diagnosis not present

## 2015-06-20 DIAGNOSIS — M25562 Pain in left knee: Secondary | ICD-10-CM | POA: Diagnosis not present

## 2015-06-20 DIAGNOSIS — G8929 Other chronic pain: Secondary | ICD-10-CM

## 2015-06-28 DIAGNOSIS — F4312 Post-traumatic stress disorder, chronic: Secondary | ICD-10-CM | POA: Diagnosis not present

## 2015-07-19 DIAGNOSIS — I472 Ventricular tachycardia: Secondary | ICD-10-CM | POA: Diagnosis not present

## 2015-07-19 DIAGNOSIS — Z4502 Encounter for adjustment and management of automatic implantable cardiac defibrillator: Secondary | ICD-10-CM | POA: Diagnosis not present

## 2015-07-19 DIAGNOSIS — I480 Paroxysmal atrial fibrillation: Secondary | ICD-10-CM | POA: Diagnosis not present

## 2015-07-19 DIAGNOSIS — I421 Obstructive hypertrophic cardiomyopathy: Secondary | ICD-10-CM | POA: Diagnosis not present

## 2015-08-09 DIAGNOSIS — F4312 Post-traumatic stress disorder, chronic: Secondary | ICD-10-CM | POA: Diagnosis not present

## 2015-09-10 DIAGNOSIS — E785 Hyperlipidemia, unspecified: Secondary | ICD-10-CM | POA: Diagnosis not present

## 2015-09-10 DIAGNOSIS — I480 Paroxysmal atrial fibrillation: Secondary | ICD-10-CM | POA: Diagnosis not present

## 2015-09-10 DIAGNOSIS — I421 Obstructive hypertrophic cardiomyopathy: Secondary | ICD-10-CM | POA: Diagnosis not present

## 2015-09-10 DIAGNOSIS — R079 Chest pain, unspecified: Secondary | ICD-10-CM | POA: Diagnosis not present

## 2015-10-01 DIAGNOSIS — F4312 Post-traumatic stress disorder, chronic: Secondary | ICD-10-CM | POA: Diagnosis not present

## 2015-11-05 DIAGNOSIS — F4312 Post-traumatic stress disorder, chronic: Secondary | ICD-10-CM | POA: Diagnosis not present

## 2015-11-26 DIAGNOSIS — Z4502 Encounter for adjustment and management of automatic implantable cardiac defibrillator: Secondary | ICD-10-CM | POA: Diagnosis not present

## 2015-11-26 DIAGNOSIS — I421 Obstructive hypertrophic cardiomyopathy: Secondary | ICD-10-CM | POA: Diagnosis not present

## 2015-12-12 DIAGNOSIS — F4312 Post-traumatic stress disorder, chronic: Secondary | ICD-10-CM | POA: Diagnosis not present

## 2016-01-09 DIAGNOSIS — F4312 Post-traumatic stress disorder, chronic: Secondary | ICD-10-CM | POA: Diagnosis not present

## 2016-01-11 ENCOUNTER — Other Ambulatory Visit: Payer: Self-pay

## 2016-01-25 DIAGNOSIS — Z4502 Encounter for adjustment and management of automatic implantable cardiac defibrillator: Secondary | ICD-10-CM | POA: Diagnosis not present

## 2016-01-25 DIAGNOSIS — I480 Paroxysmal atrial fibrillation: Secondary | ICD-10-CM | POA: Diagnosis not present

## 2016-01-25 DIAGNOSIS — I421 Obstructive hypertrophic cardiomyopathy: Secondary | ICD-10-CM | POA: Diagnosis not present

## 2016-02-05 DIAGNOSIS — F4312 Post-traumatic stress disorder, chronic: Secondary | ICD-10-CM | POA: Diagnosis not present

## 2016-02-12 DIAGNOSIS — I480 Paroxysmal atrial fibrillation: Secondary | ICD-10-CM | POA: Diagnosis not present

## 2016-02-12 DIAGNOSIS — I421 Obstructive hypertrophic cardiomyopathy: Secondary | ICD-10-CM | POA: Diagnosis not present

## 2016-03-04 DIAGNOSIS — F4312 Post-traumatic stress disorder, chronic: Secondary | ICD-10-CM | POA: Diagnosis not present

## 2016-05-19 DIAGNOSIS — Z6828 Body mass index (BMI) 28.0-28.9, adult: Secondary | ICD-10-CM | POA: Diagnosis not present

## 2016-05-19 DIAGNOSIS — R05 Cough: Secondary | ICD-10-CM | POA: Diagnosis not present

## 2016-05-19 DIAGNOSIS — E1165 Type 2 diabetes mellitus with hyperglycemia: Secondary | ICD-10-CM | POA: Diagnosis not present

## 2016-05-19 DIAGNOSIS — J209 Acute bronchitis, unspecified: Secondary | ICD-10-CM | POA: Diagnosis not present

## 2016-07-31 DIAGNOSIS — F4312 Post-traumatic stress disorder, chronic: Secondary | ICD-10-CM | POA: Diagnosis not present

## 2016-08-01 DIAGNOSIS — I480 Paroxysmal atrial fibrillation: Secondary | ICD-10-CM | POA: Diagnosis not present

## 2016-08-01 DIAGNOSIS — I10 Essential (primary) hypertension: Secondary | ICD-10-CM | POA: Diagnosis not present

## 2016-08-01 DIAGNOSIS — I4891 Unspecified atrial fibrillation: Secondary | ICD-10-CM | POA: Diagnosis not present

## 2016-08-01 DIAGNOSIS — Z95810 Presence of automatic (implantable) cardiac defibrillator: Secondary | ICD-10-CM | POA: Diagnosis not present

## 2016-08-01 DIAGNOSIS — I421 Obstructive hypertrophic cardiomyopathy: Secondary | ICD-10-CM | POA: Diagnosis not present

## 2016-08-07 DIAGNOSIS — I361 Nonrheumatic tricuspid (valve) insufficiency: Secondary | ICD-10-CM | POA: Diagnosis not present

## 2016-08-07 DIAGNOSIS — Z7901 Long term (current) use of anticoagulants: Secondary | ICD-10-CM | POA: Diagnosis not present

## 2016-08-07 DIAGNOSIS — I080 Rheumatic disorders of both mitral and aortic valves: Secondary | ICD-10-CM | POA: Diagnosis not present

## 2016-08-07 DIAGNOSIS — I4581 Long QT syndrome: Secondary | ICD-10-CM | POA: Diagnosis present

## 2016-08-07 DIAGNOSIS — I480 Paroxysmal atrial fibrillation: Secondary | ICD-10-CM | POA: Diagnosis present

## 2016-08-07 DIAGNOSIS — K219 Gastro-esophageal reflux disease without esophagitis: Secondary | ICD-10-CM | POA: Diagnosis present

## 2016-08-07 DIAGNOSIS — I421 Obstructive hypertrophic cardiomyopathy: Secondary | ICD-10-CM | POA: Diagnosis not present

## 2016-08-07 DIAGNOSIS — F329 Major depressive disorder, single episode, unspecified: Secondary | ICD-10-CM | POA: Diagnosis present

## 2016-08-07 DIAGNOSIS — I38 Endocarditis, valve unspecified: Secondary | ICD-10-CM | POA: Diagnosis present

## 2016-08-07 DIAGNOSIS — I5189 Other ill-defined heart diseases: Secondary | ICD-10-CM | POA: Diagnosis not present

## 2016-08-07 DIAGNOSIS — T827XXA Infection and inflammatory reaction due to other cardiac and vascular devices, implants and grafts, initial encounter: Secondary | ICD-10-CM | POA: Diagnosis not present

## 2016-08-07 DIAGNOSIS — M1990 Unspecified osteoarthritis, unspecified site: Secondary | ICD-10-CM | POA: Diagnosis present

## 2016-08-07 DIAGNOSIS — I472 Ventricular tachycardia: Secondary | ICD-10-CM | POA: Diagnosis not present

## 2016-08-07 DIAGNOSIS — Z79899 Other long term (current) drug therapy: Secondary | ICD-10-CM | POA: Diagnosis not present

## 2016-08-07 DIAGNOSIS — I340 Nonrheumatic mitral (valve) insufficiency: Secondary | ICD-10-CM | POA: Diagnosis not present

## 2016-08-07 DIAGNOSIS — Z880 Allergy status to penicillin: Secondary | ICD-10-CM | POA: Diagnosis not present

## 2016-08-07 DIAGNOSIS — Z791 Long term (current) use of non-steroidal anti-inflammatories (NSAID): Secondary | ICD-10-CM | POA: Diagnosis not present

## 2016-08-07 DIAGNOSIS — R531 Weakness: Secondary | ICD-10-CM | POA: Diagnosis not present

## 2016-08-07 DIAGNOSIS — F419 Anxiety disorder, unspecified: Secondary | ICD-10-CM | POA: Diagnosis not present

## 2016-08-07 DIAGNOSIS — I4510 Unspecified right bundle-branch block: Secondary | ICD-10-CM | POA: Diagnosis present

## 2016-08-07 DIAGNOSIS — I10 Essential (primary) hypertension: Secondary | ICD-10-CM | POA: Diagnosis not present

## 2016-08-07 DIAGNOSIS — R05 Cough: Secondary | ICD-10-CM | POA: Diagnosis not present

## 2016-08-07 DIAGNOSIS — E784 Other hyperlipidemia: Secondary | ICD-10-CM | POA: Diagnosis not present

## 2016-08-07 DIAGNOSIS — Z95810 Presence of automatic (implantable) cardiac defibrillator: Secondary | ICD-10-CM | POA: Diagnosis not present

## 2016-08-22 DIAGNOSIS — I4891 Unspecified atrial fibrillation: Secondary | ICD-10-CM | POA: Diagnosis not present

## 2016-08-22 DIAGNOSIS — R0602 Shortness of breath: Secondary | ICD-10-CM | POA: Diagnosis not present

## 2016-08-22 DIAGNOSIS — F329 Major depressive disorder, single episode, unspecified: Secondary | ICD-10-CM | POA: Diagnosis not present

## 2016-08-22 DIAGNOSIS — Z79899 Other long term (current) drug therapy: Secondary | ICD-10-CM | POA: Diagnosis not present

## 2016-08-22 DIAGNOSIS — I482 Chronic atrial fibrillation: Secondary | ICD-10-CM | POA: Diagnosis not present

## 2016-08-22 DIAGNOSIS — Z7901 Long term (current) use of anticoagulants: Secondary | ICD-10-CM | POA: Diagnosis not present

## 2016-08-22 DIAGNOSIS — K219 Gastro-esophageal reflux disease without esophagitis: Secondary | ICD-10-CM | POA: Diagnosis not present

## 2016-08-22 DIAGNOSIS — R079 Chest pain, unspecified: Secondary | ICD-10-CM | POA: Diagnosis not present

## 2016-08-22 DIAGNOSIS — R0789 Other chest pain: Secondary | ICD-10-CM | POA: Diagnosis not present

## 2016-08-22 DIAGNOSIS — E785 Hyperlipidemia, unspecified: Secondary | ICD-10-CM | POA: Diagnosis not present

## 2016-08-23 DIAGNOSIS — R079 Chest pain, unspecified: Secondary | ICD-10-CM | POA: Diagnosis not present

## 2016-08-23 DIAGNOSIS — I4891 Unspecified atrial fibrillation: Secondary | ICD-10-CM | POA: Diagnosis not present

## 2016-08-23 DIAGNOSIS — I4892 Unspecified atrial flutter: Secondary | ICD-10-CM | POA: Diagnosis not present

## 2016-08-24 DIAGNOSIS — R079 Chest pain, unspecified: Secondary | ICD-10-CM | POA: Diagnosis not present

## 2016-08-28 DIAGNOSIS — I10 Essential (primary) hypertension: Secondary | ICD-10-CM | POA: Diagnosis not present

## 2016-08-28 DIAGNOSIS — I421 Obstructive hypertrophic cardiomyopathy: Secondary | ICD-10-CM | POA: Diagnosis not present

## 2016-08-28 DIAGNOSIS — I480 Paroxysmal atrial fibrillation: Secondary | ICD-10-CM | POA: Diagnosis not present

## 2016-08-28 DIAGNOSIS — I483 Typical atrial flutter: Secondary | ICD-10-CM | POA: Diagnosis not present

## 2016-08-28 DIAGNOSIS — R0789 Other chest pain: Secondary | ICD-10-CM | POA: Diagnosis not present

## 2016-09-01 DIAGNOSIS — Z95810 Presence of automatic (implantable) cardiac defibrillator: Secondary | ICD-10-CM | POA: Diagnosis not present

## 2016-09-01 DIAGNOSIS — K7689 Other specified diseases of liver: Secondary | ICD-10-CM | POA: Diagnosis not present

## 2016-09-01 DIAGNOSIS — I517 Cardiomegaly: Secondary | ICD-10-CM | POA: Diagnosis not present

## 2016-09-01 DIAGNOSIS — R079 Chest pain, unspecified: Secondary | ICD-10-CM | POA: Diagnosis not present

## 2016-09-01 DIAGNOSIS — R0789 Other chest pain: Secondary | ICD-10-CM | POA: Diagnosis not present

## 2016-09-04 DIAGNOSIS — Z7901 Long term (current) use of anticoagulants: Secondary | ICD-10-CM | POA: Diagnosis not present

## 2016-09-04 DIAGNOSIS — I480 Paroxysmal atrial fibrillation: Secondary | ICD-10-CM | POA: Diagnosis not present

## 2016-09-08 DIAGNOSIS — I480 Paroxysmal atrial fibrillation: Secondary | ICD-10-CM | POA: Diagnosis not present

## 2016-09-08 DIAGNOSIS — Z7901 Long term (current) use of anticoagulants: Secondary | ICD-10-CM | POA: Diagnosis not present

## 2016-09-09 DIAGNOSIS — I480 Paroxysmal atrial fibrillation: Secondary | ICD-10-CM | POA: Diagnosis not present

## 2016-09-09 DIAGNOSIS — R079 Chest pain, unspecified: Secondary | ICD-10-CM | POA: Diagnosis not present

## 2016-09-09 DIAGNOSIS — I517 Cardiomegaly: Secondary | ICD-10-CM | POA: Diagnosis not present

## 2016-09-09 DIAGNOSIS — I5189 Other ill-defined heart diseases: Secondary | ICD-10-CM | POA: Diagnosis not present

## 2016-09-09 DIAGNOSIS — Z7901 Long term (current) use of anticoagulants: Secondary | ICD-10-CM | POA: Diagnosis not present

## 2016-09-09 DIAGNOSIS — I483 Typical atrial flutter: Secondary | ICD-10-CM | POA: Diagnosis not present

## 2016-09-09 DIAGNOSIS — I083 Combined rheumatic disorders of mitral, aortic and tricuspid valves: Secondary | ICD-10-CM | POA: Diagnosis not present

## 2016-09-15 DIAGNOSIS — Z7901 Long term (current) use of anticoagulants: Secondary | ICD-10-CM | POA: Diagnosis not present

## 2016-09-15 DIAGNOSIS — I480 Paroxysmal atrial fibrillation: Secondary | ICD-10-CM | POA: Diagnosis not present

## 2016-09-25 DIAGNOSIS — I480 Paroxysmal atrial fibrillation: Secondary | ICD-10-CM | POA: Diagnosis not present

## 2016-09-25 DIAGNOSIS — Z7901 Long term (current) use of anticoagulants: Secondary | ICD-10-CM | POA: Diagnosis not present

## 2016-09-26 DIAGNOSIS — I10 Essential (primary) hypertension: Secondary | ICD-10-CM | POA: Diagnosis not present

## 2016-09-26 DIAGNOSIS — I421 Obstructive hypertrophic cardiomyopathy: Secondary | ICD-10-CM | POA: Diagnosis not present

## 2016-09-26 DIAGNOSIS — I472 Ventricular tachycardia: Secondary | ICD-10-CM | POA: Diagnosis not present

## 2016-09-26 DIAGNOSIS — I480 Paroxysmal atrial fibrillation: Secondary | ICD-10-CM | POA: Diagnosis not present

## 2016-09-26 DIAGNOSIS — I483 Typical atrial flutter: Secondary | ICD-10-CM | POA: Diagnosis not present

## 2016-09-26 DIAGNOSIS — I340 Nonrheumatic mitral (valve) insufficiency: Secondary | ICD-10-CM | POA: Diagnosis not present

## 2016-10-02 DIAGNOSIS — Z7901 Long term (current) use of anticoagulants: Secondary | ICD-10-CM | POA: Diagnosis not present

## 2016-10-02 DIAGNOSIS — J984 Other disorders of lung: Secondary | ICD-10-CM | POA: Diagnosis not present

## 2016-10-02 DIAGNOSIS — I421 Obstructive hypertrophic cardiomyopathy: Secondary | ICD-10-CM | POA: Diagnosis not present

## 2016-10-02 DIAGNOSIS — I480 Paroxysmal atrial fibrillation: Secondary | ICD-10-CM | POA: Diagnosis not present

## 2016-10-07 DIAGNOSIS — I472 Ventricular tachycardia: Secondary | ICD-10-CM | POA: Diagnosis not present

## 2016-10-07 DIAGNOSIS — I10 Essential (primary) hypertension: Secondary | ICD-10-CM | POA: Diagnosis not present

## 2016-10-07 DIAGNOSIS — I480 Paroxysmal atrial fibrillation: Secondary | ICD-10-CM | POA: Diagnosis not present

## 2016-10-07 DIAGNOSIS — I421 Obstructive hypertrophic cardiomyopathy: Secondary | ICD-10-CM | POA: Diagnosis not present

## 2016-10-07 DIAGNOSIS — I340 Nonrheumatic mitral (valve) insufficiency: Secondary | ICD-10-CM | POA: Diagnosis not present

## 2016-10-13 DIAGNOSIS — I4891 Unspecified atrial fibrillation: Secondary | ICD-10-CM | POA: Diagnosis not present

## 2016-10-13 DIAGNOSIS — I480 Paroxysmal atrial fibrillation: Secondary | ICD-10-CM | POA: Diagnosis not present

## 2016-10-27 DIAGNOSIS — Z79899 Other long term (current) drug therapy: Secondary | ICD-10-CM | POA: Diagnosis not present

## 2016-10-27 DIAGNOSIS — F4312 Post-traumatic stress disorder, chronic: Secondary | ICD-10-CM | POA: Diagnosis not present

## 2016-10-27 DIAGNOSIS — Z7901 Long term (current) use of anticoagulants: Secondary | ICD-10-CM | POA: Diagnosis not present

## 2016-10-27 DIAGNOSIS — I480 Paroxysmal atrial fibrillation: Secondary | ICD-10-CM | POA: Diagnosis not present

## 2016-11-05 DIAGNOSIS — I480 Paroxysmal atrial fibrillation: Secondary | ICD-10-CM | POA: Diagnosis not present

## 2016-11-05 DIAGNOSIS — Z7901 Long term (current) use of anticoagulants: Secondary | ICD-10-CM | POA: Diagnosis not present

## 2016-11-18 DIAGNOSIS — I421 Obstructive hypertrophic cardiomyopathy: Secondary | ICD-10-CM | POA: Diagnosis not present

## 2016-11-18 DIAGNOSIS — Z4502 Encounter for adjustment and management of automatic implantable cardiac defibrillator: Secondary | ICD-10-CM | POA: Diagnosis not present

## 2016-11-18 DIAGNOSIS — Z7901 Long term (current) use of anticoagulants: Secondary | ICD-10-CM | POA: Diagnosis not present

## 2016-11-18 DIAGNOSIS — I480 Paroxysmal atrial fibrillation: Secondary | ICD-10-CM | POA: Diagnosis not present

## 2016-11-18 DIAGNOSIS — I420 Dilated cardiomyopathy: Secondary | ICD-10-CM | POA: Diagnosis not present

## 2016-11-18 DIAGNOSIS — I472 Ventricular tachycardia: Secondary | ICD-10-CM | POA: Diagnosis not present

## 2016-11-18 DIAGNOSIS — I10 Essential (primary) hypertension: Secondary | ICD-10-CM | POA: Diagnosis not present

## 2016-12-01 DIAGNOSIS — I517 Cardiomegaly: Secondary | ICD-10-CM | POA: Diagnosis not present

## 2016-12-01 DIAGNOSIS — I10 Essential (primary) hypertension: Secondary | ICD-10-CM | POA: Diagnosis not present

## 2016-12-01 DIAGNOSIS — F329 Major depressive disorder, single episode, unspecified: Secondary | ICD-10-CM | POA: Diagnosis not present

## 2016-12-01 DIAGNOSIS — Z01818 Encounter for other preprocedural examination: Secondary | ICD-10-CM | POA: Diagnosis not present

## 2016-12-01 DIAGNOSIS — Z880 Allergy status to penicillin: Secondary | ICD-10-CM | POA: Diagnosis not present

## 2016-12-01 DIAGNOSIS — Z79899 Other long term (current) drug therapy: Secondary | ICD-10-CM | POA: Diagnosis not present

## 2016-12-01 DIAGNOSIS — I422 Other hypertrophic cardiomyopathy: Secondary | ICD-10-CM | POA: Diagnosis not present

## 2016-12-01 DIAGNOSIS — K219 Gastro-esophageal reflux disease without esophagitis: Secondary | ICD-10-CM | POA: Diagnosis not present

## 2016-12-01 DIAGNOSIS — E785 Hyperlipidemia, unspecified: Secondary | ICD-10-CM | POA: Diagnosis not present

## 2016-12-01 DIAGNOSIS — I480 Paroxysmal atrial fibrillation: Secondary | ICD-10-CM | POA: Diagnosis not present

## 2016-12-01 DIAGNOSIS — Z7901 Long term (current) use of anticoagulants: Secondary | ICD-10-CM | POA: Diagnosis not present

## 2016-12-01 DIAGNOSIS — Z95810 Presence of automatic (implantable) cardiac defibrillator: Secondary | ICD-10-CM | POA: Diagnosis not present

## 2016-12-01 DIAGNOSIS — N200 Calculus of kidney: Secondary | ICD-10-CM | POA: Diagnosis not present

## 2016-12-01 DIAGNOSIS — I498 Other specified cardiac arrhythmias: Secondary | ICD-10-CM | POA: Diagnosis not present

## 2016-12-01 DIAGNOSIS — R079 Chest pain, unspecified: Secondary | ICD-10-CM | POA: Diagnosis not present

## 2016-12-01 DIAGNOSIS — I2510 Atherosclerotic heart disease of native coronary artery without angina pectoris: Secondary | ICD-10-CM | POA: Diagnosis not present

## 2016-12-01 DIAGNOSIS — I420 Dilated cardiomyopathy: Secondary | ICD-10-CM | POA: Diagnosis not present

## 2016-12-01 DIAGNOSIS — R0789 Other chest pain: Secondary | ICD-10-CM | POA: Diagnosis not present

## 2016-12-01 DIAGNOSIS — J984 Other disorders of lung: Secondary | ICD-10-CM | POA: Diagnosis not present

## 2016-12-01 DIAGNOSIS — Z950 Presence of cardiac pacemaker: Secondary | ICD-10-CM | POA: Diagnosis not present

## 2016-12-01 DIAGNOSIS — I421 Obstructive hypertrophic cardiomyopathy: Secondary | ICD-10-CM | POA: Diagnosis not present

## 2016-12-02 DIAGNOSIS — I517 Cardiomegaly: Secondary | ICD-10-CM | POA: Diagnosis not present

## 2016-12-02 DIAGNOSIS — I481 Persistent atrial fibrillation: Secondary | ICD-10-CM | POA: Diagnosis not present

## 2016-12-02 DIAGNOSIS — Z01818 Encounter for other preprocedural examination: Secondary | ICD-10-CM | POA: Diagnosis not present

## 2016-12-02 DIAGNOSIS — I420 Dilated cardiomyopathy: Secondary | ICD-10-CM | POA: Diagnosis not present

## 2016-12-02 DIAGNOSIS — Z95810 Presence of automatic (implantable) cardiac defibrillator: Secondary | ICD-10-CM | POA: Diagnosis not present

## 2016-12-02 DIAGNOSIS — N200 Calculus of kidney: Secondary | ICD-10-CM | POA: Diagnosis not present

## 2016-12-02 DIAGNOSIS — R0789 Other chest pain: Secondary | ICD-10-CM | POA: Diagnosis not present

## 2016-12-02 DIAGNOSIS — R079 Chest pain, unspecified: Secondary | ICD-10-CM | POA: Diagnosis not present

## 2016-12-02 DIAGNOSIS — I480 Paroxysmal atrial fibrillation: Secondary | ICD-10-CM | POA: Diagnosis not present

## 2016-12-02 DIAGNOSIS — I10 Essential (primary) hypertension: Secondary | ICD-10-CM | POA: Diagnosis not present

## 2016-12-02 DIAGNOSIS — I4891 Unspecified atrial fibrillation: Secondary | ICD-10-CM | POA: Diagnosis not present

## 2016-12-02 DIAGNOSIS — I421 Obstructive hypertrophic cardiomyopathy: Secondary | ICD-10-CM | POA: Diagnosis not present

## 2016-12-22 DIAGNOSIS — I480 Paroxysmal atrial fibrillation: Secondary | ICD-10-CM | POA: Diagnosis not present

## 2016-12-22 DIAGNOSIS — Z7901 Long term (current) use of anticoagulants: Secondary | ICD-10-CM | POA: Diagnosis not present

## 2017-01-19 DIAGNOSIS — I480 Paroxysmal atrial fibrillation: Secondary | ICD-10-CM | POA: Diagnosis not present

## 2017-01-19 DIAGNOSIS — Z7901 Long term (current) use of anticoagulants: Secondary | ICD-10-CM | POA: Diagnosis not present

## 2017-01-30 DIAGNOSIS — I480 Paroxysmal atrial fibrillation: Secondary | ICD-10-CM | POA: Diagnosis not present

## 2017-01-30 DIAGNOSIS — I472 Ventricular tachycardia: Secondary | ICD-10-CM | POA: Diagnosis not present

## 2017-01-30 DIAGNOSIS — I10 Essential (primary) hypertension: Secondary | ICD-10-CM | POA: Diagnosis not present

## 2017-01-30 DIAGNOSIS — I421 Obstructive hypertrophic cardiomyopathy: Secondary | ICD-10-CM | POA: Diagnosis not present

## 2017-01-30 DIAGNOSIS — I420 Dilated cardiomyopathy: Secondary | ICD-10-CM | POA: Diagnosis not present

## 2017-02-20 DIAGNOSIS — Z7901 Long term (current) use of anticoagulants: Secondary | ICD-10-CM | POA: Diagnosis not present

## 2017-02-20 DIAGNOSIS — I480 Paroxysmal atrial fibrillation: Secondary | ICD-10-CM | POA: Diagnosis not present

## 2017-02-26 ENCOUNTER — Encounter (HOSPITAL_COMMUNITY): Payer: Self-pay | Admitting: Emergency Medicine

## 2017-02-26 ENCOUNTER — Emergency Department (HOSPITAL_COMMUNITY): Payer: Medicare Other

## 2017-02-26 ENCOUNTER — Emergency Department (HOSPITAL_COMMUNITY)
Admission: EM | Admit: 2017-02-26 | Discharge: 2017-02-26 | Disposition: A | Discharge status: Home / Self Care | Payer: Medicare Other | Attending: Emergency Medicine | Admitting: Emergency Medicine

## 2017-02-26 DIAGNOSIS — S3992XA Unspecified injury of lower back, initial encounter: Secondary | ICD-10-CM | POA: Diagnosis not present

## 2017-02-26 DIAGNOSIS — Y92410 Unspecified street and highway as the place of occurrence of the external cause: Secondary | ICD-10-CM | POA: Diagnosis not present

## 2017-02-26 DIAGNOSIS — I259 Chronic ischemic heart disease, unspecified: Secondary | ICD-10-CM | POA: Diagnosis not present

## 2017-02-26 DIAGNOSIS — Y999 Unspecified external cause status: Secondary | ICD-10-CM | POA: Insufficient documentation

## 2017-02-26 DIAGNOSIS — M25512 Pain in left shoulder: Secondary | ICD-10-CM | POA: Diagnosis not present

## 2017-02-26 DIAGNOSIS — Z7901 Long term (current) use of anticoagulants: Secondary | ICD-10-CM | POA: Insufficient documentation

## 2017-02-26 DIAGNOSIS — Y9389 Activity, other specified: Secondary | ICD-10-CM | POA: Insufficient documentation

## 2017-02-26 DIAGNOSIS — S39012A Strain of muscle, fascia and tendon of lower back, initial encounter: Secondary | ICD-10-CM | POA: Diagnosis not present

## 2017-02-26 DIAGNOSIS — S161XXA Strain of muscle, fascia and tendon at neck level, initial encounter: Secondary | ICD-10-CM | POA: Diagnosis not present

## 2017-02-26 DIAGNOSIS — Z95810 Presence of automatic (implantable) cardiac defibrillator: Secondary | ICD-10-CM | POA: Insufficient documentation

## 2017-02-26 DIAGNOSIS — Z79899 Other long term (current) drug therapy: Secondary | ICD-10-CM | POA: Insufficient documentation

## 2017-02-26 DIAGNOSIS — S199XXA Unspecified injury of neck, initial encounter: Secondary | ICD-10-CM | POA: Diagnosis not present

## 2017-02-26 DIAGNOSIS — M545 Low back pain: Secondary | ICD-10-CM | POA: Diagnosis not present

## 2017-02-26 DIAGNOSIS — M542 Cervicalgia: Secondary | ICD-10-CM | POA: Diagnosis not present

## 2017-02-26 DIAGNOSIS — M79602 Pain in left arm: Secondary | ICD-10-CM | POA: Diagnosis not present

## 2017-02-26 HISTORY — DX: Other specified postprocedural states: Z98.890

## 2017-02-26 HISTORY — DX: Personal history of other medical treatment: Z92.89

## 2017-02-26 MED ORDER — TRAMADOL HCL 50 MG PO TABS: 50.0000 mg | ORAL_TABLET | Freq: Four times a day (QID) | ORAL | 0 refills | Status: DC | PRN

## 2017-02-26 NOTE — Discharge Instructions (Signed)
Rest at home for a few days and follow-up with your doctor if not improving. Take Tylenol or Motrin for pain and if that doesn't help he can take the Ultram
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
## 2017-03-02 DIAGNOSIS — I480 Paroxysmal atrial fibrillation: Secondary | ICD-10-CM | POA: Diagnosis not present

## 2017-03-02 DIAGNOSIS — Z7901 Long term (current) use of anticoagulants: Secondary | ICD-10-CM | POA: Diagnosis not present

## 2017-03-10 DIAGNOSIS — I472 Ventricular tachycardia: Secondary | ICD-10-CM | POA: Diagnosis not present

## 2017-03-10 DIAGNOSIS — I420 Dilated cardiomyopathy: Secondary | ICD-10-CM | POA: Diagnosis not present

## 2017-03-10 DIAGNOSIS — I340 Nonrheumatic mitral (valve) insufficiency: Secondary | ICD-10-CM | POA: Diagnosis not present

## 2017-03-10 DIAGNOSIS — Z95810 Presence of automatic (implantable) cardiac defibrillator: Secondary | ICD-10-CM | POA: Diagnosis not present

## 2017-03-10 DIAGNOSIS — I421 Obstructive hypertrophic cardiomyopathy: Secondary | ICD-10-CM | POA: Diagnosis not present

## 2017-03-10 DIAGNOSIS — Z45010 Encounter for checking and testing of cardiac pacemaker pulse generator [battery]: Secondary | ICD-10-CM | POA: Diagnosis not present

## 2017-03-10 DIAGNOSIS — I480 Paroxysmal atrial fibrillation: Secondary | ICD-10-CM | POA: Diagnosis not present

## 2017-03-10 DIAGNOSIS — I10 Essential (primary) hypertension: Secondary | ICD-10-CM | POA: Diagnosis not present

## 2017-03-11 DIAGNOSIS — Z6829 Body mass index (BMI) 29.0-29.9, adult: Secondary | ICD-10-CM | POA: Diagnosis not present

## 2017-03-11 DIAGNOSIS — F5221 Male erectile disorder: Secondary | ICD-10-CM | POA: Diagnosis not present

## 2017-03-11 DIAGNOSIS — N451 Epididymitis: Secondary | ICD-10-CM | POA: Diagnosis not present

## 2017-03-11 DIAGNOSIS — N410 Acute prostatitis: Secondary | ICD-10-CM | POA: Diagnosis not present

## 2017-03-12 ENCOUNTER — Other Ambulatory Visit: Payer: Self-pay

## 2017-03-12 ENCOUNTER — Emergency Department (HOSPITAL_COMMUNITY): Payer: Medicare Other

## 2017-03-12 ENCOUNTER — Emergency Department (HOSPITAL_COMMUNITY)
Admission: EM | Admit: 2017-03-12 | Discharge: 2017-03-12 | Disposition: A | Discharge status: Home / Self Care | Payer: Medicare Other | Attending: Emergency Medicine | Admitting: Emergency Medicine

## 2017-03-12 ENCOUNTER — Encounter (HOSPITAL_COMMUNITY): Payer: Self-pay | Admitting: Emergency Medicine

## 2017-03-12 DIAGNOSIS — M542 Cervicalgia: Secondary | ICD-10-CM | POA: Diagnosis not present

## 2017-03-12 DIAGNOSIS — Y9389 Activity, other specified: Secondary | ICD-10-CM | POA: Diagnosis not present

## 2017-03-12 DIAGNOSIS — R51 Headache: Secondary | ICD-10-CM | POA: Diagnosis not present

## 2017-03-12 DIAGNOSIS — I421 Obstructive hypertrophic cardiomyopathy: Secondary | ICD-10-CM | POA: Diagnosis not present

## 2017-03-12 DIAGNOSIS — S0990XA Unspecified injury of head, initial encounter: Secondary | ICD-10-CM | POA: Diagnosis not present

## 2017-03-12 DIAGNOSIS — Y998 Other external cause status: Secondary | ICD-10-CM | POA: Diagnosis not present

## 2017-03-12 DIAGNOSIS — Z79899 Other long term (current) drug therapy: Secondary | ICD-10-CM | POA: Insufficient documentation

## 2017-03-12 DIAGNOSIS — W19XXXA Unspecified fall, initial encounter: Secondary | ICD-10-CM

## 2017-03-12 DIAGNOSIS — I2510 Atherosclerotic heart disease of native coronary artery without angina pectoris: Secondary | ICD-10-CM | POA: Insufficient documentation

## 2017-03-12 DIAGNOSIS — Z7901 Long term (current) use of anticoagulants: Secondary | ICD-10-CM | POA: Diagnosis not present

## 2017-03-12 DIAGNOSIS — S199XXA Unspecified injury of neck, initial encounter: Secondary | ICD-10-CM | POA: Diagnosis not present

## 2017-03-12 DIAGNOSIS — S0003XA Contusion of scalp, initial encounter: Secondary | ICD-10-CM

## 2017-03-12 DIAGNOSIS — S0181XA Laceration without foreign body of other part of head, initial encounter: Secondary | ICD-10-CM | POA: Diagnosis not present

## 2017-03-12 DIAGNOSIS — Y929 Unspecified place or not applicable: Secondary | ICD-10-CM | POA: Diagnosis not present

## 2017-03-12 DIAGNOSIS — Z950 Presence of cardiac pacemaker: Secondary | ICD-10-CM | POA: Insufficient documentation

## 2017-03-12 DIAGNOSIS — I10 Essential (primary) hypertension: Secondary | ICD-10-CM | POA: Insufficient documentation

## 2017-03-12 DIAGNOSIS — W07XXXA Fall from chair, initial encounter: Secondary | ICD-10-CM | POA: Diagnosis not present

## 2017-03-12 DIAGNOSIS — R9431 Abnormal electrocardiogram [ECG] [EKG]: Secondary | ICD-10-CM | POA: Diagnosis not present

## 2017-03-12 HISTORY — DX: Presence of cardiac pacemaker: Z95.0

## 2017-03-12 HISTORY — DX: Unspecified atrial fibrillation: I48.91

## 2017-03-12 LAB — CBC WITH DIFFERENTIAL/PLATELET
Basophils Absolute: 0.1 10*3/uL (ref 0.0–0.1)
Basophils Relative: 1 %
EOS ABS: 0.2 10*3/uL (ref 0.0–0.7)
EOS PCT: 3 %
HCT: 43 % (ref 39.0–52.0)
Hemoglobin: 15.2 g/dL (ref 13.0–17.0)
LYMPHS ABS: 4.4 10*3/uL — AB (ref 0.7–4.0)
Lymphocytes Relative: 46 %
MCH: 31.9 pg (ref 26.0–34.0)
MCHC: 35.3 g/dL (ref 30.0–36.0)
MCV: 90.1 fL (ref 78.0–100.0)
MONOS PCT: 13 %
Monocytes Absolute: 1.2 10*3/uL — ABNORMAL HIGH (ref 0.1–1.0)
Neutro Abs: 3.4 10*3/uL (ref 1.7–7.7)
Neutrophils Relative %: 37 %
PLATELETS: 152 10*3/uL (ref 150–400)
RBC: 4.77 MIL/uL (ref 4.22–5.81)
RDW: 12.9 % (ref 11.5–15.5)
WBC: 9.3 10*3/uL (ref 4.0–10.5)

## 2017-03-12 LAB — COMPREHENSIVE METABOLIC PANEL
ALT: 24 U/L (ref 17–63)
ANION GAP: 8 (ref 5–15)
AST: 26 U/L (ref 15–41)
Albumin: 3.8 g/dL (ref 3.5–5.0)
Alkaline Phosphatase: 57 U/L (ref 38–126)
BUN: 14 mg/dL (ref 6–20)
CHLORIDE: 99 mmol/L — AB (ref 101–111)
CO2: 24 mmol/L (ref 22–32)
Calcium: 8.7 mg/dL — ABNORMAL LOW (ref 8.9–10.3)
Creatinine, Ser: 1.12 mg/dL (ref 0.61–1.24)
GFR calc non Af Amer: >60 mL/min (ref >60)
Glucose, Bld: 96 mg/dL (ref 65–99)
Potassium: 3.6 mmol/L (ref 3.5–5.1)
SODIUM: 131 mmol/L — AB (ref 135–145)
Total Bilirubin: 0.6 mg/dL (ref 0.3–1.2)
Total Protein: 6.8 g/dL (ref 6.5–8.1)

## 2017-03-12 LAB — PROTIME-INR
INR: 1.78
PROTHROMBIN TIME: 20.6 s — AB (ref 11.4–15.2)

## 2017-03-12 LAB — ETHANOL: Alcohol, Ethyl (B): 189 mg/dL — ABNORMAL HIGH (ref <10)

## 2017-03-12 LAB — I-STAT TROPONIN, ED: Troponin i, poc: 0.01 ng/mL (ref 0.00–0.08)

## 2017-03-12 MED ORDER — CYCLOBENZAPRINE HCL 10 MG PO TABS
10.0000 mg | ORAL_TABLET | Freq: Once | ORAL | Status: AC
  Administered 2017-03-12: 10 mg via ORAL
  Filled 2017-03-12: qty 1

## 2017-03-12 MED ORDER — CYCLOBENZAPRINE HCL 10 MG PO TABS: 5.0000 mg | ORAL_TABLET | Freq: Three times a day (TID) | ORAL | 0 refills | Status: AC | PRN

## 2017-03-12 MED ORDER — OXYCODONE-ACETAMINOPHEN 5-325 MG PO TABS
1.0000 | ORAL_TABLET | Freq: Once | ORAL | Status: AC
  Administered 2017-03-12: 1 via ORAL
  Filled 2017-03-12: qty 1

## 2017-03-12 MED ORDER — KETOROLAC TROMETHAMINE 30 MG/ML IJ SOLN
15.0000 mg | Freq: Once | INTRAMUSCULAR | Status: AC
  Administered 2017-03-12: 15 mg via INTRAVENOUS
  Filled 2017-03-12: qty 1
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
## 2017-03-12 NOTE — ED Notes (Addendum)
Per [employer], pt had an atrial arrhythmia today at approx 0400. However aside from the episode this AM, no ventricular episodes were recorded. EDP notified, [employer] to fax report.

## 2017-03-12 NOTE — ED Notes (Signed)
Pt in CT at this time.

## 2017-03-12 NOTE — ED Provider Notes (Signed)
[facility] EMERGENCY DEPARTMENT Provider Note   CSN: [id number] Arrival date & time: 03/12/17  2031 History   Chief Complaint Chief Complaint  Patient presents with  . Fall   HPI Walter Raymond is a 71 y.o. male with PMH of Afib, HTN, and CAD who presents after a fall out of a chair while at dinner. The circumstance surrounding the fall are unclear but the patient denies any associated chest pain, shortness of breath, dizziness or lightheadedness.  Denies any loss of consciousness.  Complains of head and neck pain at this time. The patient states he hit the back of his head on another hard object possible a another chair.   The history is provided by the patient.  Fall  This is a new problem. The current episode started less than 1 hour ago. The problem occurs rarely. The problem has been resolved. Associated symptoms include headaches. Pertinent negatives include no chest pain, no abdominal pain and no shortness of breath. Nothing aggravates the symptoms. Nothing relieves the symptoms. He has tried nothing for the symptoms.   Past Medical History:  Diagnosis Date  . A-fib ([place])   . Coronary artery disease   . History of cardioversion   . Hypercholesteremia   . Pacemaker   . PTSD (post-traumatic stress disorder)   . S/P cardiac cath 11/18/2013   There are no active problems to display for this patient.  Past Surgical History:  Procedure Laterality Date  . CARDIAC DEFIBRILLATOR PLACEMENT    . CARDIAC DEFIBRILLATOR PLACEMENT    . PACEMAKER IMPLANT      Home Medications    Prior to Admission medications   Medication Sig Start Date End Date Taking? Authorizing Provider  cyclobenzaprine (FLEXERIL) 5 MG tablet Take 1 tablet (5 mg total) by mouth 3 (three) times daily as needed for muscle spasms. 02/09/15   Evalee Jefferson, PA-C  FLUoxetine (PROZAC) 40 MG capsule Take 40 mg by mouth daily. 12/14/13   [provider]  furosemide (LASIX) 40 MG tablet Take 1  tablet by mouth daily. 12/15/16   [provider]  lisinopril (PRINIVIL,ZESTRIL) 10 MG tablet Take 10 mg by mouth daily. 02/09/14 02/09/15  [provider]  lisinopril (PRINIVIL,ZESTRIL) 2.5 MG tablet Take 1 tablet by mouth daily. 01/30/17   [provider]  meloxicam (MOBIC) 7.5 MG tablet Take 7.5 mg by mouth every morning.    [provider]  metoprolol succinate (TOPROL-XL) 25 MG 24 hr tablet Take 1 tablet by mouth daily. 09/26/16   [provider]  nabumetone (RELAFEN) 500 MG tablet Take 500 mg by mouth as directed.    [provider]  nitroGLYCERIN (NITROSTAT) 0.4 MG SL tablet Place under the tongue. 08/24/16 08/24/17  [provider]  oxyCODONE-acetaminophen (PERCOCET/ROXICET) 5-325 MG tablet Take 1 tablet by mouth every 4 (four) hours as needed. 02/09/15   Evalee Jefferson, PA-C  pravastatin (PRAVACHOL) 20 MG tablet Take 1 tablet by mouth daily. 02/18/17   [provider]  ranitidine (ZANTAC) 150 MG tablet Take 150 mg by mouth at bedtime. 06/20/14   [provider]  simvastatin (ZOCOR) 40 MG tablet Take 40 mg by mouth at bedtime.    [provider]  sotalol (BETAPACE) 120 MG tablet Take 1 tablet by mouth 2 (two) times daily. 01/28/17   [provider]  sotalol (BETAPACE) 80 MG tablet Take 40 mg by mouth 2 (two) times daily. 01/26/15   [provider]  traMADol (ULTRAM) 50 MG tablet  Take 1 tablet (50 mg total) by mouth every 6 (six) hours as needed. 02/26/17   Milton Ferguson, MD  traZODone (DESYREL) 50 MG tablet Take 50 mg by mouth at bedtime. 01/18/15   [provider]  warfarin (COUMADIN) 5 MG tablet Take 1 tablet by mouth daily. 01/14/17   [provider]  XARELTO 20 MG TABS tablet Take 20 mg by mouth daily. 01/26/15   [provider]   Family History Family History  Problem Relation Age of Onset  . Heart failure Mother    Social History Social History  Substance Use  Topics  . Smoking status: Never Smoker  . Smokeless tobacco: Never Used  . Alcohol use 12.6 oz/week    21 Cans of beer per week     Comment: occ   Allergies   Penicillins  Review of Systems Review of Systems  Constitutional: Negative for chills and fever.  HENT: Negative for ear pain and sore throat.   Eyes: Negative for pain and visual disturbance.  Respiratory: Negative for cough and shortness of breath.   Cardiovascular: Negative for chest pain and palpitations.  Gastrointestinal: Negative for abdominal pain and vomiting.  Genitourinary: Negative for dysuria and hematuria.  Musculoskeletal: Positive for neck pain and neck stiffness. Negative for arthralgias and back pain.  Skin: Negative for color change and rash.  Neurological: Positive for headaches. Negative for seizures and syncope.  All other systems reviewed and are negative.  Physical Exam Updated Vital Signs BP (!) 155/94 (BP Location: Right Arm)   Pulse 79   Temp 97.7 F (36.5 C) (Oral)   Resp (!) 22   Ht 5\' 8"  (1.727 m)   Wt 86.2 kg (190 lb)   SpO2 97%   BMI 28.89 kg/m   Physical Exam  Constitutional: He is oriented to person, place, and time. He appears well-developed and well-nourished. Cervical collar in place.  HENT:  Head: Normocephalic. Head is with contusion (over the occipital area).  Eyes: Pupils are equal, round, and reactive to light. Conjunctivae and EOM are normal.  Neck: Neck supple.  Cardiovascular: Normal rate and regular rhythm.   No murmur heard. Pulmonary/Chest: Effort normal and breath sounds normal. No respiratory distress.  Abdominal: Soft. Bowel sounds are normal. There is no tenderness.  Musculoskeletal: Normal range of motion. He exhibits no edema.  Neurological: He is alert and oriented to person, place, and time.  Skin: Skin is warm and dry.  Psychiatric: He has a normal mood and affect.  Nursing note and vitals reviewed.  ED Treatments / Results  Labs (all labs ordered  are listed, but only abnormal results are displayed) Labs Reviewed  ETHANOL - Abnormal; Notable for the following:       Result Value   Alcohol, Ethyl (B) 189 (*)    All other components within normal limits  CBC WITH DIFFERENTIAL/PLATELET - Abnormal; Notable for the following:    Lymphs Abs 4.4 (*)    Monocytes Absolute 1.2 (*)    All other components within normal limits  COMPREHENSIVE METABOLIC PANEL - Abnormal; Notable for the following:    Sodium 131 (*)    Chloride 99 (*)    Calcium 8.7 (*)    All other components within normal limits  PROTIME-INR - Abnormal; Notable for the following:    Prothrombin Time 20.6 (*)    All other components within normal limits  I-STAT TROPONIN, ED    EKG  EKG Interpretation  Date/Time:  Thursday March 12 2017 21:27:57  EDT Ventricular Rate:  80 PR Interval:  190 QRS Duration: 190 QT Interval:  491 QTC Calculation: 567 R Axis:   -99 Text Interpretation:  Sinus rhythm Ventricular premature complex Biatrial enlargement Nonspecific IVCD with LAD Confirmed by Pattricia Boss [phone]) on 03/12/2017 9:35:32 PM       Radiology Ct Head Wo Contrast  Result Date: 03/12/2017 CLINICAL DATA:  Initial evaluation for acute trauma, fall.  The EXAM: CT HEAD WITHOUT CONTRAST CT CERVICAL SPINE WITHOUT CONTRAST TECHNIQUE: Multidetector CT imaging of the head and cervical spine was performed following the standard protocol without intravenous contrast. Multiplanar CT image reconstructions of the cervical spine were also generated. COMPARISON:  None. FINDINGS: CT HEAD FINDINGS Brain: Generalized age related cerebral atrophy. Patchy hypodensity within the periventricular and deep white matter both cerebral hemispheres, nonspecific, but most like related chronic small vessel ischemic disease, moderate nature. No acute intracranial hemorrhage. No evidence for acute large vessel territory infarct. No mass lesion, midline shift or mass effect. No hydrocephalus. No  extra-axial fluid collection. Vascular: No hyperdense vessel. Skull: Small left posterior scalp contusion noted. Calvarium intact. Sinuses/Orbits: Globes and orbital soft tissues within normal limits. Chronic maxillary sinusitis noted, right worse than left. Trace bilateral mastoid effusions, slightly larger on the right. CT CERVICAL SPINE FINDINGS Alignment: Trace retrolisthesis of C4 on C5, likely chronic and degenerative. Vertebral bodies otherwise normally aligned with preservation of the normal cervical lordosis. Skull base and vertebrae: Skullbase intact. Normal C1-2 articulations are preserved in the dens is intact. Vertebral body heights maintained. No acute fracture identified. Soft tissues and spinal canal: Soft tissues of the neck demonstrate no acute abnormality. No abnormal prevertebral edema. Atherosclerotic calcifications about the carotid bifurcations. Disc levels:  Mild degenerate spondylolysis noted at C4-5. Upper chest: Visualized upper chest within normal limits. Partially visualized lung apices are clear. No apical pneumothorax. IMPRESSION: CT BRAIN: 1. No acute intracranial process identified. 2. Small left posterior scalp contusion. 3. Age-related cerebral atrophy with moderate chronic small vessel ischemic disease. 4. Chronic maxillary sinusitis. CT CERVICAL SPINE: 1. No acute traumatic injury within the cervical spine. 2. 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 Electronically Signed   By: Jeannine Boga M.D.   On: 03/12/2017 23:12   Ct Cervical Spine Wo Contrast  Result Date: 03/12/2017 CLINICAL DATA:  Initial evaluation for acute trauma, fall.  The EXAM: CT HEAD WITHOUT CONTRAST CT CERVICAL SPINE WITHOUT CONTRAST TECHNIQUE: Multidetector CT imaging of the head and cervical spine was performed following the standard protocol without intravenous contrast. Multiplanar CT image reconstructions of the cervical spine were also generated. COMPARISON:  None. FINDINGS: CT HEAD FINDINGS  Brain: Generalized age related cerebral atrophy. Patchy hypodensity within the periventricular and deep white matter both cerebral hemispheres, nonspecific, but most like related chronic small vessel ischemic disease, moderate nature. No acute intracranial hemorrhage. No evidence for acute large vessel territory infarct. No mass lesion, midline shift or mass effect. No hydrocephalus. No extra-axial fluid collection. Vascular: No hyperdense vessel. Skull: Small left posterior scalp contusion noted. Calvarium intact. Sinuses/Orbits: Globes and orbital soft tissues within normal limits. Chronic maxillary sinusitis noted, right worse than left. Trace bilateral mastoid effusions, slightly larger on the right. CT CERVICAL SPINE FINDINGS Alignment: Trace retrolisthesis of C4 on C5, likely chronic and degenerative. Vertebral bodies otherwise normally aligned with preservation of the normal cervical lordosis. Skull base and vertebrae: Skullbase intact. Normal C1-2 articulations are preserved in the dens is intact. Vertebral body heights maintained. No acute fracture identified. Soft tissues and spinal canal:  Soft tissues of the neck demonstrate no acute abnormality. No abnormal prevertebral edema. Atherosclerotic calcifications about the carotid bifurcations. Disc levels:  Mild degenerate spondylolysis noted at C4-5. Upper chest: Visualized upper chest within normal limits. Partially visualized lung apices are clear. No apical pneumothorax. IMPRESSION: CT BRAIN: 1. No acute intracranial process identified. 2. Small left posterior scalp contusion. 3. Age-related cerebral atrophy with moderate chronic small vessel ischemic disease. 4. Chronic maxillary sinusitis. CT CERVICAL SPINE: 1. No acute traumatic injury within the cervical spine. 2. 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 Electronically Signed   By: Jeannine Boga M.D.   On: 03/12/2017 23:12    Procedures Procedures (including critical care time)  Medications  Ordered in ED Medications - No data to display  Initial Impression / Assessment and Plan / ED Course  I have reviewed the triage vital signs and the nursing notes.  Pertinent labs & imaging results that were available during my care of the patient were reviewed by me and considered in my medical decision making (see chart for details).  Pt is a 71 y.o. male with pertinent PMHX afib who presents with headache and neck pain after a fall.   The patient is elderly and has multiple comorbidities therefore will obtain basic labs performed and EKG. The patient does take anticoagulants, therefore will obtain CT of head and neck.   Pacemaker interrogated without any findings of recent arrhythmia EKG: ventricular paced rhythm   Patient given pain medication in the emergency department  CBC within normal limits no leukocytosis, no evidence of anemia, normal platelets, CMP: mild hyponatremia sodium of 131 otherwise unremarkable.  Troponin negative.  INR 1.78 consistent with warfarin use.  EtOH 189  CT scan: without any acute finding, full reports above.   C-spine cleared in the ED.   Labs and imaging reviewed by myself and considered in medical decision making if ordered.  Imaging interpreted by radiology.  Patient discharged in stable condition  The plan for this patient was discussed with Dr. Jeanell Sparrow, who voiced agreement and who oversaw evaluation and treatment of this patient.   Final Clinical Impressions(s) / ED Diagnoses   Final diagnoses:  Fall, initial encounter  Injury of head, initial encounter  Contusion of scalp, initial encounter   New Prescriptions Discharge Medication List as of 03/12/2017 11:29 PM       Fenton Foy, MD 03/13/17 Ofilia Neas    Pattricia Boss, MD 03/14/17 1510

## 2017-03-12 NOTE — ED Notes (Addendum)
Pt with 7/10 pain, Dr. Levonne Lapping notified. Verbal order to interrogate pacemaker. Pt [employer] ICD interrogated by this RN.

## 2017-03-12 NOTE — ED Notes (Signed)
ED Provider at bedside. 

## 2017-03-16 DIAGNOSIS — S060X9A Concussion with loss of consciousness of unspecified duration, initial encounter: Secondary | ICD-10-CM | POA: Diagnosis not present

## 2017-03-16 DIAGNOSIS — S161XXA Strain of muscle, fascia and tendon at neck level, initial encounter: Secondary | ICD-10-CM | POA: Diagnosis not present

## 2017-03-16 DIAGNOSIS — Z6829 Body mass index (BMI) 29.0-29.9, adult: Secondary | ICD-10-CM | POA: Diagnosis not present

## 2017-03-18 DIAGNOSIS — Z7901 Long term (current) use of anticoagulants: Secondary | ICD-10-CM | POA: Diagnosis not present

## 2017-03-18 DIAGNOSIS — I480 Paroxysmal atrial fibrillation: Secondary | ICD-10-CM | POA: Diagnosis not present

## 2017-03-25 DIAGNOSIS — I480 Paroxysmal atrial fibrillation: Secondary | ICD-10-CM | POA: Diagnosis not present

## 2017-03-25 DIAGNOSIS — Z7901 Long term (current) use of anticoagulants: Secondary | ICD-10-CM | POA: Diagnosis not present

## 2017-03-31 DIAGNOSIS — Z1389 Encounter for screening for other disorder: Secondary | ICD-10-CM | POA: Diagnosis not present

## 2017-03-31 DIAGNOSIS — S161XXA Strain of muscle, fascia and tendon at neck level, initial encounter: Secondary | ICD-10-CM | POA: Diagnosis not present

## 2017-03-31 DIAGNOSIS — Z0001 Encounter for general adult medical examination with abnormal findings: Secondary | ICD-10-CM | POA: Diagnosis not present

## 2017-03-31 DIAGNOSIS — F411 Generalized anxiety disorder: Secondary | ICD-10-CM | POA: Diagnosis not present

## 2017-03-31 DIAGNOSIS — N410 Acute prostatitis: Secondary | ICD-10-CM | POA: Diagnosis not present

## 2017-03-31 DIAGNOSIS — Z6829 Body mass index (BMI) 29.0-29.9, adult: Secondary | ICD-10-CM | POA: Diagnosis not present

## 2017-03-31 DIAGNOSIS — S060X9A Concussion with loss of consciousness of unspecified duration, initial encounter: Secondary | ICD-10-CM | POA: Diagnosis not present

## 2017-04-10 DIAGNOSIS — I480 Paroxysmal atrial fibrillation: Secondary | ICD-10-CM | POA: Diagnosis not present

## 2017-04-10 DIAGNOSIS — Z7901 Long term (current) use of anticoagulants: Secondary | ICD-10-CM | POA: Diagnosis not present

## 2017-04-21 DIAGNOSIS — S060X9A Concussion with loss of consciousness of unspecified duration, initial encounter: Secondary | ICD-10-CM | POA: Diagnosis not present

## 2017-04-21 DIAGNOSIS — S46911D Strain of unspecified muscle, fascia and tendon at shoulder and upper arm level, right arm, subsequent encounter: Secondary | ICD-10-CM | POA: Diagnosis not present

## 2017-04-21 DIAGNOSIS — N410 Acute prostatitis: Secondary | ICD-10-CM | POA: Diagnosis not present

## 2017-04-21 DIAGNOSIS — S161XXA Strain of muscle, fascia and tendon at neck level, initial encounter: Secondary | ICD-10-CM | POA: Diagnosis not present

## 2017-04-21 DIAGNOSIS — Z6829 Body mass index (BMI) 29.0-29.9, adult: Secondary | ICD-10-CM | POA: Diagnosis not present

## 2017-04-21 DIAGNOSIS — F411 Generalized anxiety disorder: Secondary | ICD-10-CM | POA: Diagnosis not present

## 2017-04-23 DIAGNOSIS — I481 Persistent atrial fibrillation: Secondary | ICD-10-CM | POA: Diagnosis not present

## 2017-04-23 DIAGNOSIS — F4312 Post-traumatic stress disorder, chronic: Secondary | ICD-10-CM | POA: Diagnosis not present

## 2017-05-28 DIAGNOSIS — Z6829 Body mass index (BMI) 29.0-29.9, adult: Secondary | ICD-10-CM | POA: Diagnosis not present

## 2017-05-28 DIAGNOSIS — F411 Generalized anxiety disorder: Secondary | ICD-10-CM | POA: Diagnosis not present

## 2017-05-28 DIAGNOSIS — S161XXD Strain of muscle, fascia and tendon at neck level, subsequent encounter: Secondary | ICD-10-CM | POA: Diagnosis not present

## 2017-05-28 DIAGNOSIS — S060X9A Concussion with loss of consciousness of unspecified duration, initial encounter: Secondary | ICD-10-CM | POA: Diagnosis not present

## 2017-05-28 DIAGNOSIS — S46911D Strain of unspecified muscle, fascia and tendon at shoulder and upper arm level, right arm, subsequent encounter: Secondary | ICD-10-CM | POA: Diagnosis not present

## 2017-05-28 DIAGNOSIS — I482 Chronic atrial fibrillation: Secondary | ICD-10-CM | POA: Diagnosis not present

## 2017-06-01 DIAGNOSIS — F4312 Post-traumatic stress disorder, chronic: Secondary | ICD-10-CM | POA: Diagnosis not present

## 2017-07-02 DIAGNOSIS — F4312 Post-traumatic stress disorder, chronic: Secondary | ICD-10-CM | POA: Diagnosis not present

## 2017-07-02 DIAGNOSIS — F411 Generalized anxiety disorder: Secondary | ICD-10-CM | POA: Diagnosis not present

## 2017-07-02 DIAGNOSIS — S161XXD Strain of muscle, fascia and tendon at neck level, subsequent encounter: Secondary | ICD-10-CM | POA: Diagnosis not present

## 2017-07-02 DIAGNOSIS — Z6829 Body mass index (BMI) 29.0-29.9, adult: Secondary | ICD-10-CM | POA: Diagnosis not present

## 2017-07-02 DIAGNOSIS — S46911D Strain of unspecified muscle, fascia and tendon at shoulder and upper arm level, right arm, subsequent encounter: Secondary | ICD-10-CM | POA: Diagnosis not present

## 2017-07-02 DIAGNOSIS — I481 Persistent atrial fibrillation: Secondary | ICD-10-CM | POA: Diagnosis not present

## 2017-07-02 DIAGNOSIS — S060X9A Concussion with loss of consciousness of unspecified duration, initial encounter: Secondary | ICD-10-CM | POA: Diagnosis not present

## 2017-07-18 DIAGNOSIS — I481 Persistent atrial fibrillation: Secondary | ICD-10-CM | POA: Diagnosis not present

## 2017-07-18 DIAGNOSIS — J019 Acute sinusitis, unspecified: Secondary | ICD-10-CM | POA: Diagnosis not present

## 2017-07-18 DIAGNOSIS — Z6829 Body mass index (BMI) 29.0-29.9, adult: Secondary | ICD-10-CM | POA: Diagnosis not present

## 2017-07-20 DIAGNOSIS — H40033 Anatomical narrow angle, bilateral: Secondary | ICD-10-CM | POA: Diagnosis not present

## 2017-07-20 DIAGNOSIS — H2513 Age-related nuclear cataract, bilateral: Secondary | ICD-10-CM | POA: Diagnosis not present

## 2017-07-21 DIAGNOSIS — I481 Persistent atrial fibrillation: Secondary | ICD-10-CM | POA: Diagnosis not present

## 2017-08-07 DIAGNOSIS — I481 Persistent atrial fibrillation: Secondary | ICD-10-CM | POA: Diagnosis not present

## 2017-08-21 DIAGNOSIS — I481 Persistent atrial fibrillation: Secondary | ICD-10-CM | POA: Diagnosis not present

## 2017-09-02 DIAGNOSIS — S060X9A Concussion with loss of consciousness of unspecified duration, initial encounter: Secondary | ICD-10-CM | POA: Diagnosis not present

## 2017-09-02 DIAGNOSIS — S161XXD Strain of muscle, fascia and tendon at neck level, subsequent encounter: Secondary | ICD-10-CM | POA: Diagnosis not present

## 2017-09-02 DIAGNOSIS — Z6829 Body mass index (BMI) 29.0-29.9, adult: Secondary | ICD-10-CM | POA: Diagnosis not present

## 2017-09-02 DIAGNOSIS — S46911D Strain of unspecified muscle, fascia and tendon at shoulder and upper arm level, right arm, subsequent encounter: Secondary | ICD-10-CM | POA: Diagnosis not present

## 2017-09-02 DIAGNOSIS — F411 Generalized anxiety disorder: Secondary | ICD-10-CM | POA: Diagnosis not present

## 2017-09-02 DIAGNOSIS — I481 Persistent atrial fibrillation: Secondary | ICD-10-CM | POA: Diagnosis not present

## 2017-09-24 DIAGNOSIS — I420 Dilated cardiomyopathy: Secondary | ICD-10-CM | POA: Diagnosis not present

## 2017-09-24 DIAGNOSIS — I340 Nonrheumatic mitral (valve) insufficiency: Secondary | ICD-10-CM | POA: Diagnosis not present

## 2017-09-24 DIAGNOSIS — I480 Paroxysmal atrial fibrillation: Secondary | ICD-10-CM | POA: Diagnosis not present

## 2017-09-24 DIAGNOSIS — Z4502 Encounter for adjustment and management of automatic implantable cardiac defibrillator: Secondary | ICD-10-CM | POA: Diagnosis not present

## 2017-09-24 DIAGNOSIS — I472 Ventricular tachycardia: Secondary | ICD-10-CM | POA: Diagnosis not present

## 2017-09-24 DIAGNOSIS — R0789 Other chest pain: Secondary | ICD-10-CM | POA: Diagnosis not present

## 2017-10-02 DIAGNOSIS — I481 Persistent atrial fibrillation: Secondary | ICD-10-CM | POA: Diagnosis not present

## 2017-10-30 DIAGNOSIS — I481 Persistent atrial fibrillation: Secondary | ICD-10-CM | POA: Diagnosis not present

## 2017-11-02 DIAGNOSIS — I480 Paroxysmal atrial fibrillation: Secondary | ICD-10-CM | POA: Diagnosis not present

## 2017-11-02 DIAGNOSIS — I420 Dilated cardiomyopathy: Secondary | ICD-10-CM | POA: Diagnosis not present

## 2017-11-02 DIAGNOSIS — I348 Other nonrheumatic mitral valve disorders: Secondary | ICD-10-CM | POA: Diagnosis not present

## 2017-11-02 DIAGNOSIS — Z95810 Presence of automatic (implantable) cardiac defibrillator: Secondary | ICD-10-CM | POA: Diagnosis not present

## 2017-11-02 DIAGNOSIS — R0789 Other chest pain: Secondary | ICD-10-CM | POA: Diagnosis not present

## 2017-11-02 DIAGNOSIS — I340 Nonrheumatic mitral (valve) insufficiency: Secondary | ICD-10-CM | POA: Diagnosis not present

## 2017-11-02 DIAGNOSIS — I472 Ventricular tachycardia: Secondary | ICD-10-CM | POA: Diagnosis not present

## 2017-11-04 ENCOUNTER — Emergency Department (HOSPITAL_COMMUNITY): Payer: Medicare Other

## 2017-11-04 ENCOUNTER — Emergency Department (HOSPITAL_COMMUNITY)
Admission: EM | Admit: 2017-11-04 | Discharge: 2017-11-04 | Disposition: A | Discharge status: Home / Self Care | Payer: Medicare Other | Attending: Emergency Medicine | Admitting: Emergency Medicine

## 2017-11-04 ENCOUNTER — Encounter (HOSPITAL_COMMUNITY): Payer: Self-pay | Admitting: Emergency Medicine

## 2017-11-04 ENCOUNTER — Other Ambulatory Visit: Payer: Self-pay

## 2017-11-04 DIAGNOSIS — Z7901 Long term (current) use of anticoagulants: Secondary | ICD-10-CM | POA: Insufficient documentation

## 2017-11-04 DIAGNOSIS — Y93E1 Activity, personal bathing and showering: Secondary | ICD-10-CM | POA: Insufficient documentation

## 2017-11-04 DIAGNOSIS — Z23 Encounter for immunization: Secondary | ICD-10-CM | POA: Diagnosis not present

## 2017-11-04 DIAGNOSIS — Z79899 Other long term (current) drug therapy: Secondary | ICD-10-CM | POA: Insufficient documentation

## 2017-11-04 DIAGNOSIS — W182XXA Fall in (into) shower or empty bathtub, initial encounter: Secondary | ICD-10-CM | POA: Insufficient documentation

## 2017-11-04 DIAGNOSIS — S199XXA Unspecified injury of neck, initial encounter: Secondary | ICD-10-CM | POA: Diagnosis not present

## 2017-11-04 DIAGNOSIS — S0101XA Laceration without foreign body of scalp, initial encounter: Secondary | ICD-10-CM | POA: Diagnosis not present

## 2017-11-04 DIAGNOSIS — S3992XA Unspecified injury of lower back, initial encounter: Secondary | ICD-10-CM

## 2017-11-04 DIAGNOSIS — S060X0A Concussion without loss of consciousness, initial encounter: Secondary | ICD-10-CM | POA: Diagnosis not present

## 2017-11-04 DIAGNOSIS — W19XXXA Unspecified fall, initial encounter: Secondary | ICD-10-CM

## 2017-11-04 DIAGNOSIS — Z950 Presence of cardiac pacemaker: Secondary | ICD-10-CM | POA: Diagnosis not present

## 2017-11-04 DIAGNOSIS — S0990XA Unspecified injury of head, initial encounter: Secondary | ICD-10-CM | POA: Diagnosis not present

## 2017-11-04 DIAGNOSIS — Y92002 Bathroom of unspecified non-institutional (private) residence single-family (private) house as the place of occurrence of the external cause: Secondary | ICD-10-CM | POA: Diagnosis not present

## 2017-11-04 DIAGNOSIS — R791 Abnormal coagulation profile: Secondary | ICD-10-CM

## 2017-11-04 DIAGNOSIS — Y999 Unspecified external cause status: Secondary | ICD-10-CM | POA: Insufficient documentation

## 2017-11-04 DIAGNOSIS — I2510 Atherosclerotic heart disease of native coronary artery without angina pectoris: Secondary | ICD-10-CM | POA: Diagnosis not present

## 2017-11-04 LAB — PROTIME-INR
INR: 1.59
Prothrombin Time: 18.8 seconds — ABNORMAL HIGH (ref 11.4–15.2)

## 2017-11-04 LAB — CBC WITH DIFFERENTIAL/PLATELET
BASOS ABS: 0.1 10*3/uL (ref 0.0–0.1)
BASOS PCT: 1 %
EOS ABS: 0.2 10*3/uL (ref 0.0–0.7)
EOS PCT: 3 %
HCT: 45.8 % (ref 39.0–52.0)
Hemoglobin: 15.5 g/dL (ref 13.0–17.0)
Lymphocytes Relative: 46 %
Lymphs Abs: 4.1 10*3/uL — ABNORMAL HIGH (ref 0.7–4.0)
MCH: 31.8 pg (ref 26.0–34.0)
MCHC: 33.8 g/dL (ref 30.0–36.0)
MCV: 93.9 fL (ref 78.0–100.0)
MONO ABS: 0.9 10*3/uL (ref 0.1–1.0)
Monocytes Relative: 10 %
NEUTROS ABS: 3.4 10*3/uL (ref 1.7–7.7)
Neutrophils Relative %: 40 %
PLATELETS: 160 10*3/uL (ref 150–400)
RBC: 4.88 MIL/uL (ref 4.22–5.81)
RDW: 12.9 % (ref 11.5–15.5)
WBC: 8.6 10*3/uL (ref 4.0–10.5)

## 2017-11-04 MED ORDER — ONDANSETRON 4 MG PO TBDP
4.0000 mg | ORAL_TABLET | Freq: Once | ORAL | Status: AC
  Administered 2017-11-04: 4 mg via ORAL
  Filled 2017-11-04: qty 1

## 2017-11-04 MED ORDER — ONDANSETRON HCL 4 MG/2ML IJ SOLN
4.0000 mg | Freq: Once | INTRAMUSCULAR | Status: AC
  Administered 2017-11-04: 4 mg via INTRAVENOUS
  Filled 2017-11-04: qty 2

## 2017-11-04 MED ORDER — FENTANYL CITRATE (PF) 100 MCG/2ML IJ SOLN
50.0000 ug | Freq: Once | INTRAMUSCULAR | Status: AC
  Administered 2017-11-04: 50 ug via INTRAVENOUS
  Filled 2017-11-04: qty 2

## 2017-11-04 MED ORDER — HYDROCODONE-ACETAMINOPHEN 5-325 MG PO TABS: 1.0000 | ORAL_TABLET | Freq: Four times a day (QID) | ORAL | 0 refills | Status: DC | PRN

## 2017-11-04 MED ORDER — LIDOCAINE-EPINEPHRINE-TETRACAINE (LET) SOLUTION
3.0000 mL | Freq: Once | NASAL | Status: AC
  Administered 2017-11-04: 3 mL via TOPICAL
  Filled 2017-11-04: qty 3

## 2017-11-04 MED ORDER — TETANUS-DIPHTH-ACELL PERTUSSIS 5-2.5-18.5 LF-MCG/0.5 IM SUSP
0.5000 mL | Freq: Once | INTRAMUSCULAR | Status: AC
  Administered 2017-11-04: 0.5 mL via INTRAMUSCULAR
  Filled 2017-11-04: qty 0.5

## 2017-11-04 MED ORDER — LIDOCAINE-EPINEPHRINE (PF) 2 %-1:200000 IJ SOLN
INTRAMUSCULAR | Status: AC
  Administered 2017-11-04: 20 mL
  Filled 2017-11-04: qty 20

## 2017-11-04 NOTE — ED Notes (Signed)
Pt transported to CT ?

## 2017-11-04 NOTE — Discharge Instructions (Signed)
You can take 1 extra dose of your Coumadin tonight when you get home.  Go back to regular dosing Wednesday night. Make sure you have your Coumadin level checked early next week

## 2017-11-04 NOTE — ED Provider Notes (Signed)
[facility] EMERGENCY DEPARTMENT Provider Note   CSN: [id number] Arrival date & time: 11/04/17  0143     History   Chief Complaint Chief Complaint  Patient presents with  . Fall    HPI Walter Raymond is a 72 y.o. male.  The history is provided by the patient and the spouse.  Fall  This is a new problem. The current episode started less than 1 hour ago. The problem occurs constantly. Associated symptoms include headaches. Pertinent negatives include no chest pain, no abdominal pain and no shortness of breath. Nothing aggravates the symptoms. Nothing relieves the symptoms.  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 Patient slipped in the shower.  He hit his head.  No LOC.  He reports headache, neck pain, tailbone pain.  No chest pain or abdominal pain reported.  No extremity trauma reported.  He does take Coumadin No ICD shocks report  Past Medical History:  Diagnosis Date  . A-fib ([place])   . Coronary artery disease   . History of cardioversion   . Hypercholesteremia   . Pacemaker   . PTSD (post-traumatic stress disorder)   . S/P cardiac cath 11/18/2013    There are no active problems to display for this patient.   Past Surgical History:  Procedure Laterality Date  . CARDIAC DEFIBRILLATOR PLACEMENT    . CARDIAC DEFIBRILLATOR PLACEMENT    . PACEMAKER IMPLANT          Home Medications    Prior to Admission medications   Medication Sig Start Date End Date Taking? Authorizing Provider  FLUoxetine (PROZAC) 40 MG capsule Take 40 mg by mouth daily. 12/14/13   [provider]  furosemide (LASIX) 40 MG tablet Take 1 tablet by mouth daily. 12/15/16   [provider]  lisinopril (PRINIVIL,ZESTRIL) 10 MG tablet Take 10 mg by mouth daily. 02/09/14 02/09/15  [provider]  lisinopril (PRINIVIL,ZESTRIL) 2.5 MG tablet Take 1 tablet by mouth daily. 01/30/17   [provider]  meloxicam (MOBIC) 7.5  MG tablet Take 7.5 mg by mouth every morning.    [provider]  metoprolol succinate (TOPROL-XL) 25 MG 24 hr tablet Take 1 tablet by mouth daily. 09/26/16   [provider]  nabumetone (RELAFEN) 500 MG tablet Take 500 mg by mouth as directed.    [provider]  nitroGLYCERIN (NITROSTAT) 0.4 MG SL tablet Place under the tongue. 08/24/16 08/24/17  [provider]  pravastatin (PRAVACHOL) 20 MG tablet Take 1 tablet by mouth daily. 02/18/17   [provider]  ranitidine (ZANTAC) 150 MG tablet Take 150 mg by mouth at bedtime. 06/20/14   [provider]  simvastatin (ZOCOR) 40 MG tablet Take 40 mg by mouth at bedtime.    [provider]  sotalol (BETAPACE) 120 MG tablet Take 1 tablet by mouth 2 (two) times daily. 01/28/17   [provider]  sotalol (BETAPACE) 80 MG tablet Take 40 mg by mouth 2 (two) times daily. 01/26/15   [provider]  traZODone (DESYREL) 50 MG tablet Take 50 mg by mouth at bedtime. 01/18/15   [provider]  warfarin (COUMADIN) 5 MG tablet Take 1 tablet by mouth daily. 01/14/17   [provider]    Family History Family History  Problem Relation Age of Onset  . Heart failure Mother     Social History Social History   Tobacco Use  . Smoking status: Never Smoker  . Smokeless  tobacco: Never Used  Substance Use Topics  . Alcohol use: Yes    Alcohol/week: 12.6 oz    Types: 21 Cans of beer per week    Comment: occ  . Drug use: No     Allergies   Penicillins   Review of Systems Review of Systems  Constitutional: Negative for fever.  Respiratory: Negative for shortness of breath.   Cardiovascular: Negative for chest pain.  Gastrointestinal: Positive for nausea. Negative for abdominal pain and vomiting.  Musculoskeletal: Positive for back pain and neck pain.  Neurological: Positive for headaches.  All other systems reviewed and are negative.    Physical Exam Updated  Vital Signs BP 109/60 (BP Location: Right Arm)   Temp 98.2 F (36.8 C) (Oral)   Resp 17   Ht 1.727 m (5\' 8" )   Wt 77.1 kg (170 lb)   SpO2 99%   BMI 25.85 kg/m   Physical Exam CONSTITUTIONAL: Well developed/well nourished, uncomfortable appearing HEAD: Y-shaped laceration to posterior scalp, bleeding controlled, no other signs of head injury EYES: EOMI/PERRL ENMT: Mucous membranes moist, no signs of facial trauma SPINE/BACK: Mild cervical spine tenderness, no thoracic or lumbar tenderness, no bruising/crepitance/stepoffs noted to spine CV: S1/S2 noted, no murmurs/rubs/gallops noted LUNGS: Lungs are clear to auscultation bilaterally, no apparent distress Chest-no tenderness or crepitus ABDOMEN: soft, nontender, no bruising GU:no cva tenderness NEURO: Pt is awake/alert/appropriate, moves all extremitiesx4.  No facial droop.  GCS is 15 EXTREMITIES: pulses normal/equal, full ROM, all extremities/joints palpated/ranged and nontender, pelvis stable SKIN: warm, color normal PSYCH: no abnormalities of mood noted, alert and oriented to situation   ED Treatments / Results  Labs (all labs ordered are listed, but only abnormal results are displayed) Labs Reviewed  CBC WITH DIFFERENTIAL/PLATELET - Abnormal; Notable for the following components:      Result Value   Lymphs Abs 4.1 (*)    All other components within normal limits  PROTIME-INR - Abnormal; Notable for the following components:   Prothrombin Time 18.8 (*)    All other components within normal limits    EKG None  Radiology Ct Head Wo Contrast  Result Date: 11/04/2017 CLINICAL DATA:  Patient fell at home hitting back of head. Patient on Coumadin. Laceration posteriorly. EXAM: CT HEAD WITHOUT CONTRAST CT CERVICAL SPINE WITHOUT CONTRAST TECHNIQUE: Multidetector CT imaging of the head and cervical spine was performed following the standard protocol without intravenous contrast. Multiplanar CT image reconstructions of the  cervical spine were also generated. 03/12/2017 FINDINGS: CT HEAD FINDINGS Brain: Chronic appearing small vessel ischemic disease of periventricular and subcortical white matter. No acute intracranial hemorrhage, large vascular territory infarct, edema or midline shift. No hydrocephalus. Midline fourth ventricle and basal cisterns. No intra-axial mass nor extra-axial fluid collections. Vascular: No hyperdense vessel sign. Skull: No acute skull fracture. Sinuses/Orbits: Globes and orbital soft tissues are within normal limits. Chronic maxillary sinusitis right greater than left with trace mastoid effusions inferiorly. Other: Mild right posterior parietal scalp contusion. CT CERVICAL SPINE FINDINGS Alignment: Minimal grade 1 retrolisthesis of C4 on C5 unchanged. Otherwise, the cervical spine is maintained in lordosis. Intact craniocervical relationship. Osteoarthritis of the atlantodental interval. Skull base and vertebrae: Hypodense appears intact. No vertebral fracture. No jumped or perched facets. Intact skull base without osseous abnormality. Soft tissues and spinal canal: No prevertebral soft tissue swelling or edema. Atherosclerotic calcifications of the carotid bifurcations. No intraspinal hematoma. Disc levels: Mild degenerative disc disease and uncovertebral joint spurring at C4-5. Upper chest: Clear lung apices. Other:  None IMPRESSION: CT head: 1. Stable chronic small vessel ischemic disease. 2. Right posterior parietal scalp contusion. 3. Chronic maxillary sinusitis. 4. Trace mastoid effusions. CT cervical spine: 1. No acute cervical spine fracture. No prevertebral soft tissue abnormality. 2. Mild degenerative disc disease C4-5. Electronically Signed   By: Ashley Royalty M.D.   On: 11/04/2017 03:11   Ct Cervical Spine Wo Contrast  Result Date: 11/04/2017 CLINICAL DATA:  Patient fell at home hitting back of head. Patient on Coumadin. Laceration posteriorly. EXAM: CT HEAD WITHOUT CONTRAST CT CERVICAL SPINE  WITHOUT CONTRAST TECHNIQUE: Multidetector CT imaging of the head and cervical spine was performed following the standard protocol without intravenous contrast. Multiplanar CT image reconstructions of the cervical spine were also generated. 03/12/2017 FINDINGS: CT HEAD FINDINGS Brain: Chronic appearing small vessel ischemic disease of periventricular and subcortical white matter. No acute intracranial hemorrhage, large vascular territory infarct, edema or midline shift. No hydrocephalus. Midline fourth ventricle and basal cisterns. No intra-axial mass nor extra-axial fluid collections. Vascular: No hyperdense vessel sign. Skull: No acute skull fracture. Sinuses/Orbits: Globes and orbital soft tissues are within normal limits. Chronic maxillary sinusitis right greater than left with trace mastoid effusions inferiorly. Other: Mild right posterior parietal scalp contusion. CT CERVICAL SPINE FINDINGS Alignment: Minimal grade 1 retrolisthesis of C4 on C5 unchanged. Otherwise, the cervical spine is maintained in lordosis. Intact craniocervical relationship. Osteoarthritis of the atlantodental interval. Skull base and vertebrae: Hypodense appears intact. No vertebral fracture. No jumped or perched facets. Intact skull base without osseous abnormality. Soft tissues and spinal canal: No prevertebral soft tissue swelling or edema. Atherosclerotic calcifications of the carotid bifurcations. No intraspinal hematoma. Disc levels: Mild degenerative disc disease and uncovertebral joint spurring at C4-5. Upper chest: Clear lung apices. Other: None IMPRESSION: CT head: 1. Stable chronic small vessel ischemic disease. 2. Right posterior parietal scalp contusion. 3. Chronic maxillary sinusitis. 4. Trace mastoid effusions. CT cervical spine: 1. No acute cervical spine fracture. No prevertebral soft tissue abnormality. 2. Mild degenerative disc disease C4-5. Electronically Signed   By: Ashley Royalty M.D.   On: 11/04/2017 03:11     Procedures .Marland KitchenLaceration Repair Date/Time: 11/04/2017 3:52 AM Performed by: Ripley Fraise, MD Authorized by: Ripley Fraise, MD   Consent:    Consent obtained:  Verbal   Consent given by:  Patient Laceration details:    Location:  Scalp   Scalp location:  Occipital   Length (cm):  3 Pre-procedure details:    Preparation:  Patient was prepped and draped in usual sterile fashion Exploration:    Wound exploration: entire depth of wound probed and visualized     Contaminated: no   Treatment:    Area cleansed with:  Shur-Clens   Amount of cleaning:  Standard Skin repair:    Repair method:  Sutures   Suture size:  4-0   Wound skin closure material used: vicryl.   Suture technique:  Simple interrupted   Number of sutures:  6 Approximation:    Approximation:  Close Post-procedure details:    Patient tolerance of procedure:  Tolerated well, no immediate complications Comments:     Patient with a Y-shaped wound, total size approximately 3 cm Patient tolerated procedure very well, wound approximated well     Medications Ordered in ED Medications  ondansetron (ZOFRAN-ODT) disintegrating tablet 4 mg (4 mg Oral Given 11/04/17 0210)  fentaNYL (SUBLIMAZE) injection 50 mcg (50 mcg Intravenous Given 11/04/17 0232)  Tdap (BOOSTRIX) injection 0.5 mL (0.5 mLs Intramuscular Given 11/04/17 0234)  lidocaine-EPINEPHrine-tetracaine (LET) solution (3 mLs Topical Given 11/04/17 0257)  lidocaine-EPINEPHrine (XYLOCAINE W/EPI) 2 %-1:200000 (PF) injection (20 mLs  Given 11/04/17 0308)  ondansetron (ZOFRAN) injection 4 mg (4 mg Intravenous Given 11/04/17 0308)     Initial Impression / Assessment and Plan / ED Course  I have reviewed the triage vital signs and the nursing notes.  Pertinent labs & imaging results that were available during my care of the patient were reviewed by me and considered in my medical decision making (see chart for details). Narcotic database reviewed and considered in  decision making    3:52 AM Patient Presents after mechanical fall in the shower.  Scalp laceration was repaired without difficulty.  No signs of any acute head or C-spine injury.  Patient with likely concussion. His Coumadin is subtherapeutic.  He reports this was noted recently by his PCP and he took an extra dose one day earlier this week.  I have advised him after discussing with pharmacy that he can take 1 extra dose Coumadin tonight, and then go back to his regular dosing.  He needs to have his Coumadin rechecked early next week.  He reports "tailbone pain" He has no T/L tenderness.  No spinal bruising.  No bruising to buttocks.  Pelvis is stable, no tenderness with range of motion of either hip.  He is able to ambulate unassisted Will discharge home  Final Clinical Impressions(s) / ED Diagnoses   Final diagnoses:  Fall, initial encounter  Concussion without loss of consciousness, initial encounter  Laceration of scalp, initial encounter  Injury of coccyx, initial encounter  Subtherapeutic international normalized ratio (INR)    ED Discharge Orders        Ordered    HYDROcodone-acetaminophen (NORCO/VICODIN) 5-325 MG tablet  Every 6 hours PRN     11/04/17 0344       Ripley Fraise, MD 11/04/17 [phone]

## 2017-11-04 NOTE — ED Triage Notes (Signed)
Pt fell at home 0130 in the bathroom, pt states he slipped and fell hitting his head, buttocks and back. Pt is complaining of head, back and buttocks pain. Pt states he is taking coumadin. Pt has a 1 inch gash on the right side of the top of his head on the back.

## 2017-11-06 DIAGNOSIS — I480 Paroxysmal atrial fibrillation: Secondary | ICD-10-CM | POA: Diagnosis not present

## 2017-11-06 DIAGNOSIS — I472 Ventricular tachycardia: Secondary | ICD-10-CM | POA: Diagnosis not present

## 2017-11-06 DIAGNOSIS — I420 Dilated cardiomyopathy: Secondary | ICD-10-CM | POA: Diagnosis not present

## 2017-11-06 DIAGNOSIS — I10 Essential (primary) hypertension: Secondary | ICD-10-CM | POA: Diagnosis not present

## 2017-11-06 DIAGNOSIS — I421 Obstructive hypertrophic cardiomyopathy: Secondary | ICD-10-CM | POA: Diagnosis not present

## 2017-11-09 DIAGNOSIS — F4312 Post-traumatic stress disorder, chronic: Secondary | ICD-10-CM | POA: Diagnosis not present

## 2017-11-27 DIAGNOSIS — I481 Persistent atrial fibrillation: Secondary | ICD-10-CM | POA: Diagnosis not present

## 2017-12-08 DIAGNOSIS — I482 Chronic atrial fibrillation: Secondary | ICD-10-CM | POA: Diagnosis not present

## 2017-12-08 DIAGNOSIS — Z6829 Body mass index (BMI) 29.0-29.9, adult: Secondary | ICD-10-CM | POA: Diagnosis not present

## 2017-12-08 DIAGNOSIS — F411 Generalized anxiety disorder: Secondary | ICD-10-CM | POA: Diagnosis not present

## 2017-12-08 DIAGNOSIS — S161XXD Strain of muscle, fascia and tendon at neck level, subsequent encounter: Secondary | ICD-10-CM | POA: Diagnosis not present

## 2017-12-08 DIAGNOSIS — E782 Mixed hyperlipidemia: Secondary | ICD-10-CM | POA: Diagnosis not present

## 2017-12-08 DIAGNOSIS — S46911D Strain of unspecified muscle, fascia and tendon at shoulder and upper arm level, right arm, subsequent encounter: Secondary | ICD-10-CM | POA: Diagnosis not present

## 2017-12-08 DIAGNOSIS — S060X9A Concussion with loss of consciousness of unspecified duration, initial encounter: Secondary | ICD-10-CM | POA: Diagnosis not present

## 2017-12-28 DIAGNOSIS — I429 Cardiomyopathy, unspecified: Secondary | ICD-10-CM | POA: Diagnosis not present

## 2017-12-28 DIAGNOSIS — I482 Chronic atrial fibrillation: Secondary | ICD-10-CM | POA: Diagnosis not present

## 2018-01-04 DIAGNOSIS — I480 Paroxysmal atrial fibrillation: Secondary | ICD-10-CM | POA: Diagnosis not present

## 2018-01-04 DIAGNOSIS — I421 Obstructive hypertrophic cardiomyopathy: Secondary | ICD-10-CM | POA: Diagnosis not present

## 2018-01-04 DIAGNOSIS — I10 Essential (primary) hypertension: Secondary | ICD-10-CM | POA: Diagnosis not present

## 2018-01-04 DIAGNOSIS — I420 Dilated cardiomyopathy: Secondary | ICD-10-CM | POA: Diagnosis not present

## 2018-01-08 DIAGNOSIS — I482 Chronic atrial fibrillation: Secondary | ICD-10-CM | POA: Diagnosis not present

## 2018-01-22 DIAGNOSIS — I482 Chronic atrial fibrillation: Secondary | ICD-10-CM | POA: Diagnosis not present

## 2018-01-29 DIAGNOSIS — I482 Chronic atrial fibrillation: Secondary | ICD-10-CM | POA: Diagnosis not present

## 2018-03-05 DIAGNOSIS — I4891 Unspecified atrial fibrillation: Secondary | ICD-10-CM | POA: Diagnosis not present

## 2018-03-08 DIAGNOSIS — E785 Hyperlipidemia, unspecified: Secondary | ICD-10-CM | POA: Diagnosis not present

## 2018-03-08 DIAGNOSIS — K219 Gastro-esophageal reflux disease without esophagitis: Secondary | ICD-10-CM | POA: Diagnosis not present

## 2018-03-08 DIAGNOSIS — Z95810 Presence of automatic (implantable) cardiac defibrillator: Secondary | ICD-10-CM | POA: Diagnosis not present

## 2018-03-08 DIAGNOSIS — E8770 Fluid overload, unspecified: Secondary | ICD-10-CM | POA: Diagnosis not present

## 2018-03-08 DIAGNOSIS — Z7901 Long term (current) use of anticoagulants: Secondary | ICD-10-CM | POA: Diagnosis not present

## 2018-03-08 DIAGNOSIS — F329 Major depressive disorder, single episode, unspecified: Secondary | ICD-10-CM | POA: Diagnosis not present

## 2018-03-08 DIAGNOSIS — I10 Essential (primary) hypertension: Secondary | ICD-10-CM | POA: Diagnosis not present

## 2018-03-08 DIAGNOSIS — F419 Anxiety disorder, unspecified: Secondary | ICD-10-CM | POA: Diagnosis not present

## 2018-03-08 DIAGNOSIS — I480 Paroxysmal atrial fibrillation: Secondary | ICD-10-CM | POA: Diagnosis not present

## 2018-03-08 DIAGNOSIS — R0601 Orthopnea: Secondary | ICD-10-CM | POA: Diagnosis not present

## 2018-03-08 DIAGNOSIS — I483 Typical atrial flutter: Secondary | ICD-10-CM | POA: Diagnosis not present

## 2018-03-08 DIAGNOSIS — I2510 Atherosclerotic heart disease of native coronary artery without angina pectoris: Secondary | ICD-10-CM | POA: Diagnosis not present

## 2018-03-08 DIAGNOSIS — I119 Hypertensive heart disease without heart failure: Secondary | ICD-10-CM | POA: Diagnosis not present

## 2018-03-08 DIAGNOSIS — I482 Chronic atrial fibrillation, unspecified: Secondary | ICD-10-CM | POA: Diagnosis not present

## 2018-03-08 DIAGNOSIS — Z79899 Other long term (current) drug therapy: Secondary | ICD-10-CM | POA: Diagnosis not present

## 2018-03-08 DIAGNOSIS — I421 Obstructive hypertrophic cardiomyopathy: Secondary | ICD-10-CM | POA: Diagnosis not present

## 2018-03-08 DIAGNOSIS — R0789 Other chest pain: Secondary | ICD-10-CM | POA: Diagnosis not present

## 2018-03-08 DIAGNOSIS — K222 Esophageal obstruction: Secondary | ICD-10-CM | POA: Diagnosis not present

## 2018-03-09 DIAGNOSIS — I483 Typical atrial flutter: Secondary | ICD-10-CM | POA: Diagnosis not present

## 2018-03-09 DIAGNOSIS — R0601 Orthopnea: Secondary | ICD-10-CM | POA: Diagnosis not present

## 2018-03-09 DIAGNOSIS — I4811 Longstanding persistent atrial fibrillation: Secondary | ICD-10-CM | POA: Diagnosis not present

## 2018-03-09 DIAGNOSIS — I119 Hypertensive heart disease without heart failure: Secondary | ICD-10-CM | POA: Diagnosis not present

## 2018-03-09 DIAGNOSIS — I340 Nonrheumatic mitral (valve) insufficiency: Secondary | ICD-10-CM | POA: Diagnosis not present

## 2018-03-09 DIAGNOSIS — E785 Hyperlipidemia, unspecified: Secondary | ICD-10-CM | POA: Diagnosis not present

## 2018-03-09 DIAGNOSIS — I4891 Unspecified atrial fibrillation: Secondary | ICD-10-CM | POA: Diagnosis not present

## 2018-03-09 DIAGNOSIS — E8770 Fluid overload, unspecified: Secondary | ICD-10-CM | POA: Diagnosis not present

## 2018-03-09 DIAGNOSIS — I071 Rheumatic tricuspid insufficiency: Secondary | ICD-10-CM | POA: Diagnosis not present

## 2018-03-09 DIAGNOSIS — I482 Chronic atrial fibrillation, unspecified: Secondary | ICD-10-CM | POA: Diagnosis not present

## 2018-03-09 DIAGNOSIS — I358 Other nonrheumatic aortic valve disorders: Secondary | ICD-10-CM | POA: Diagnosis not present

## 2018-03-09 DIAGNOSIS — I480 Paroxysmal atrial fibrillation: Secondary | ICD-10-CM | POA: Diagnosis not present

## 2018-03-16 DIAGNOSIS — I421 Obstructive hypertrophic cardiomyopathy: Secondary | ICD-10-CM | POA: Diagnosis not present

## 2018-03-16 DIAGNOSIS — F419 Anxiety disorder, unspecified: Secondary | ICD-10-CM | POA: Diagnosis not present

## 2018-03-16 DIAGNOSIS — I10 Essential (primary) hypertension: Secondary | ICD-10-CM | POA: Diagnosis not present

## 2018-03-16 DIAGNOSIS — Z7901 Long term (current) use of anticoagulants: Secondary | ICD-10-CM | POA: Diagnosis not present

## 2018-03-16 DIAGNOSIS — I483 Typical atrial flutter: Secondary | ICD-10-CM | POA: Diagnosis not present

## 2018-03-16 DIAGNOSIS — Z79899 Other long term (current) drug therapy: Secondary | ICD-10-CM | POA: Diagnosis not present

## 2018-03-16 DIAGNOSIS — F329 Major depressive disorder, single episode, unspecified: Secondary | ICD-10-CM | POA: Diagnosis not present

## 2018-03-16 DIAGNOSIS — I480 Paroxysmal atrial fibrillation: Secondary | ICD-10-CM | POA: Diagnosis not present

## 2018-03-16 DIAGNOSIS — M1990 Unspecified osteoarthritis, unspecified site: Secondary | ICD-10-CM | POA: Diagnosis not present

## 2018-03-16 DIAGNOSIS — K219 Gastro-esophageal reflux disease without esophagitis: Secondary | ICD-10-CM | POA: Diagnosis not present

## 2018-03-16 DIAGNOSIS — E785 Hyperlipidemia, unspecified: Secondary | ICD-10-CM | POA: Diagnosis not present

## 2018-03-16 DIAGNOSIS — Z950 Presence of cardiac pacemaker: Secondary | ICD-10-CM | POA: Diagnosis not present

## 2018-03-16 DIAGNOSIS — I2510 Atherosclerotic heart disease of native coronary artery without angina pectoris: Secondary | ICD-10-CM | POA: Diagnosis not present

## 2018-03-17 DIAGNOSIS — I2510 Atherosclerotic heart disease of native coronary artery without angina pectoris: Secondary | ICD-10-CM | POA: Diagnosis not present

## 2018-03-17 DIAGNOSIS — I421 Obstructive hypertrophic cardiomyopathy: Secondary | ICD-10-CM | POA: Diagnosis not present

## 2018-03-17 DIAGNOSIS — I517 Cardiomegaly: Secondary | ICD-10-CM | POA: Diagnosis not present

## 2018-03-17 DIAGNOSIS — I059 Rheumatic mitral valve disease, unspecified: Secondary | ICD-10-CM | POA: Diagnosis not present

## 2018-03-17 DIAGNOSIS — I10 Essential (primary) hypertension: Secondary | ICD-10-CM | POA: Diagnosis not present

## 2018-03-17 DIAGNOSIS — I088 Other rheumatic multiple valve diseases: Secondary | ICD-10-CM | POA: Diagnosis not present

## 2018-03-17 DIAGNOSIS — I4892 Unspecified atrial flutter: Secondary | ICD-10-CM | POA: Diagnosis not present

## 2018-03-17 DIAGNOSIS — I480 Paroxysmal atrial fibrillation: Secondary | ICD-10-CM | POA: Diagnosis not present

## 2018-03-17 DIAGNOSIS — I700 Atherosclerosis of aorta: Secondary | ICD-10-CM | POA: Diagnosis not present

## 2018-03-17 DIAGNOSIS — I483 Typical atrial flutter: Secondary | ICD-10-CM | POA: Diagnosis not present

## 2018-03-17 DIAGNOSIS — E785 Hyperlipidemia, unspecified: Secondary | ICD-10-CM | POA: Diagnosis not present

## 2018-03-18 DIAGNOSIS — I480 Paroxysmal atrial fibrillation: Secondary | ICD-10-CM | POA: Diagnosis not present

## 2018-03-18 DIAGNOSIS — I4892 Unspecified atrial flutter: Secondary | ICD-10-CM | POA: Diagnosis not present

## 2018-03-30 ENCOUNTER — Other Ambulatory Visit: Payer: Self-pay

## 2018-04-07 DIAGNOSIS — I10 Essential (primary) hypertension: Secondary | ICD-10-CM | POA: Diagnosis not present

## 2018-04-07 DIAGNOSIS — Z7901 Long term (current) use of anticoagulants: Secondary | ICD-10-CM | POA: Diagnosis not present

## 2018-04-07 DIAGNOSIS — I420 Dilated cardiomyopathy: Secondary | ICD-10-CM | POA: Diagnosis not present

## 2018-04-07 DIAGNOSIS — I421 Obstructive hypertrophic cardiomyopathy: Secondary | ICD-10-CM | POA: Diagnosis not present

## 2018-04-07 DIAGNOSIS — I480 Paroxysmal atrial fibrillation: Secondary | ICD-10-CM | POA: Diagnosis not present

## 2018-04-07 DIAGNOSIS — Z4502 Encounter for adjustment and management of automatic implantable cardiac defibrillator: Secondary | ICD-10-CM | POA: Diagnosis not present

## 2018-04-19 DIAGNOSIS — I480 Paroxysmal atrial fibrillation: Secondary | ICD-10-CM | POA: Diagnosis not present

## 2018-04-19 DIAGNOSIS — Z01812 Encounter for preprocedural laboratory examination: Secondary | ICD-10-CM | POA: Diagnosis not present

## 2018-04-19 DIAGNOSIS — Z79899 Other long term (current) drug therapy: Secondary | ICD-10-CM | POA: Diagnosis not present

## 2018-04-19 DIAGNOSIS — I421 Obstructive hypertrophic cardiomyopathy: Secondary | ICD-10-CM | POA: Diagnosis not present

## 2018-04-19 DIAGNOSIS — Z01810 Encounter for preprocedural cardiovascular examination: Secondary | ICD-10-CM | POA: Diagnosis not present

## 2018-04-19 DIAGNOSIS — I420 Dilated cardiomyopathy: Secondary | ICD-10-CM | POA: Diagnosis not present

## 2018-04-20 DIAGNOSIS — I4891 Unspecified atrial fibrillation: Secondary | ICD-10-CM | POA: Diagnosis not present

## 2018-04-20 DIAGNOSIS — I420 Dilated cardiomyopathy: Secondary | ICD-10-CM | POA: Diagnosis not present

## 2018-04-20 DIAGNOSIS — I421 Obstructive hypertrophic cardiomyopathy: Secondary | ICD-10-CM | POA: Diagnosis not present

## 2018-04-20 DIAGNOSIS — Z01812 Encounter for preprocedural laboratory examination: Secondary | ICD-10-CM | POA: Diagnosis not present

## 2018-04-20 DIAGNOSIS — I480 Paroxysmal atrial fibrillation: Secondary | ICD-10-CM | POA: Diagnosis not present

## 2018-04-20 DIAGNOSIS — Z79899 Other long term (current) drug therapy: Secondary | ICD-10-CM | POA: Diagnosis not present

## 2018-04-20 DIAGNOSIS — Z01810 Encounter for preprocedural cardiovascular examination: Secondary | ICD-10-CM | POA: Diagnosis not present

## 2018-05-11 DIAGNOSIS — J111 Influenza due to unidentified influenza virus with other respiratory manifestations: Secondary | ICD-10-CM | POA: Diagnosis not present

## 2018-05-11 DIAGNOSIS — R05 Cough: Secondary | ICD-10-CM | POA: Diagnosis not present

## 2018-05-11 DIAGNOSIS — Z6828 Body mass index (BMI) 28.0-28.9, adult: Secondary | ICD-10-CM | POA: Diagnosis not present

## 2018-05-31 DIAGNOSIS — I4892 Unspecified atrial flutter: Secondary | ICD-10-CM | POA: Diagnosis not present

## 2018-05-31 DIAGNOSIS — I480 Paroxysmal atrial fibrillation: Secondary | ICD-10-CM | POA: Diagnosis not present

## 2018-05-31 DIAGNOSIS — R5382 Chronic fatigue, unspecified: Secondary | ICD-10-CM | POA: Diagnosis not present

## 2018-05-31 DIAGNOSIS — I421 Obstructive hypertrophic cardiomyopathy: Secondary | ICD-10-CM | POA: Diagnosis not present

## 2018-05-31 DIAGNOSIS — I420 Dilated cardiomyopathy: Secondary | ICD-10-CM | POA: Diagnosis not present

## 2018-05-31 DIAGNOSIS — K219 Gastro-esophageal reflux disease without esophagitis: Secondary | ICD-10-CM | POA: Diagnosis not present

## 2018-05-31 DIAGNOSIS — E782 Mixed hyperlipidemia: Secondary | ICD-10-CM | POA: Diagnosis not present

## 2018-05-31 DIAGNOSIS — E1165 Type 2 diabetes mellitus with hyperglycemia: Secondary | ICD-10-CM | POA: Diagnosis not present

## 2018-06-08 DIAGNOSIS — Z95810 Presence of automatic (implantable) cardiac defibrillator: Secondary | ICD-10-CM | POA: Diagnosis not present

## 2018-06-08 DIAGNOSIS — E785 Hyperlipidemia, unspecified: Secondary | ICD-10-CM | POA: Diagnosis not present

## 2018-06-08 DIAGNOSIS — I480 Paroxysmal atrial fibrillation: Secondary | ICD-10-CM | POA: Diagnosis not present

## 2018-06-08 DIAGNOSIS — F329 Major depressive disorder, single episode, unspecified: Secondary | ICD-10-CM | POA: Diagnosis not present

## 2018-06-08 DIAGNOSIS — Z79899 Other long term (current) drug therapy: Secondary | ICD-10-CM | POA: Diagnosis not present

## 2018-06-08 DIAGNOSIS — Z7901 Long term (current) use of anticoagulants: Secondary | ICD-10-CM | POA: Diagnosis not present

## 2018-06-08 DIAGNOSIS — K219 Gastro-esophageal reflux disease without esophagitis: Secondary | ICD-10-CM | POA: Diagnosis not present

## 2018-06-08 DIAGNOSIS — I420 Dilated cardiomyopathy: Secondary | ICD-10-CM | POA: Diagnosis not present

## 2018-06-08 DIAGNOSIS — M1990 Unspecified osteoarthritis, unspecified site: Secondary | ICD-10-CM | POA: Diagnosis not present

## 2018-06-08 DIAGNOSIS — I421 Obstructive hypertrophic cardiomyopathy: Secondary | ICD-10-CM | POA: Diagnosis not present

## 2018-06-08 DIAGNOSIS — I10 Essential (primary) hypertension: Secondary | ICD-10-CM | POA: Diagnosis not present

## 2018-06-08 DIAGNOSIS — F419 Anxiety disorder, unspecified: Secondary | ICD-10-CM | POA: Diagnosis not present

## 2018-06-08 DIAGNOSIS — I483 Typical atrial flutter: Secondary | ICD-10-CM | POA: Diagnosis not present

## 2018-06-09 DIAGNOSIS — I4892 Unspecified atrial flutter: Secondary | ICD-10-CM | POA: Diagnosis not present

## 2018-06-09 DIAGNOSIS — I10 Essential (primary) hypertension: Secondary | ICD-10-CM | POA: Diagnosis not present

## 2018-06-09 DIAGNOSIS — F419 Anxiety disorder, unspecified: Secondary | ICD-10-CM | POA: Diagnosis not present

## 2018-06-09 DIAGNOSIS — I483 Typical atrial flutter: Secondary | ICD-10-CM | POA: Diagnosis not present

## 2018-06-09 DIAGNOSIS — I480 Paroxysmal atrial fibrillation: Secondary | ICD-10-CM | POA: Diagnosis not present

## 2018-06-09 DIAGNOSIS — I420 Dilated cardiomyopathy: Secondary | ICD-10-CM | POA: Diagnosis not present

## 2018-06-09 DIAGNOSIS — F329 Major depressive disorder, single episode, unspecified: Secondary | ICD-10-CM | POA: Diagnosis not present

## 2018-07-13 DIAGNOSIS — I429 Cardiomyopathy, unspecified: Secondary | ICD-10-CM | POA: Diagnosis not present

## 2018-07-26 DIAGNOSIS — I429 Cardiomyopathy, unspecified: Secondary | ICD-10-CM | POA: Diagnosis not present

## 2018-07-26 DIAGNOSIS — I340 Nonrheumatic mitral (valve) insufficiency: Secondary | ICD-10-CM | POA: Diagnosis not present

## 2018-07-26 DIAGNOSIS — I480 Paroxysmal atrial fibrillation: Secondary | ICD-10-CM | POA: Diagnosis not present

## 2018-07-26 DIAGNOSIS — I421 Obstructive hypertrophic cardiomyopathy: Secondary | ICD-10-CM | POA: Diagnosis not present

## 2018-07-26 DIAGNOSIS — I420 Dilated cardiomyopathy: Secondary | ICD-10-CM | POA: Diagnosis not present

## 2018-08-16 DIAGNOSIS — I517 Cardiomegaly: Secondary | ICD-10-CM | POA: Diagnosis not present

## 2018-08-16 DIAGNOSIS — I5189 Other ill-defined heart diseases: Secondary | ICD-10-CM | POA: Diagnosis not present

## 2018-08-16 DIAGNOSIS — I340 Nonrheumatic mitral (valve) insufficiency: Secondary | ICD-10-CM | POA: Diagnosis not present

## 2018-08-16 DIAGNOSIS — I359 Nonrheumatic aortic valve disorder, unspecified: Secondary | ICD-10-CM | POA: Diagnosis not present

## 2018-08-27 DIAGNOSIS — I420 Dilated cardiomyopathy: Secondary | ICD-10-CM | POA: Diagnosis not present

## 2018-08-27 DIAGNOSIS — I472 Ventricular tachycardia: Secondary | ICD-10-CM | POA: Diagnosis not present

## 2018-08-27 DIAGNOSIS — I480 Paroxysmal atrial fibrillation: Secondary | ICD-10-CM | POA: Diagnosis not present

## 2018-08-27 DIAGNOSIS — I421 Obstructive hypertrophic cardiomyopathy: Secondary | ICD-10-CM | POA: Diagnosis not present

## 2018-08-27 DIAGNOSIS — Z95810 Presence of automatic (implantable) cardiac defibrillator: Secondary | ICD-10-CM | POA: Diagnosis not present

## 2018-09-03 DIAGNOSIS — I4892 Unspecified atrial flutter: Secondary | ICD-10-CM | POA: Diagnosis not present

## 2018-09-03 DIAGNOSIS — K222 Esophageal obstruction: Secondary | ICD-10-CM | POA: Diagnosis not present

## 2018-09-03 DIAGNOSIS — M1990 Unspecified osteoarthritis, unspecified site: Secondary | ICD-10-CM | POA: Diagnosis not present

## 2018-09-03 DIAGNOSIS — I509 Heart failure, unspecified: Secondary | ICD-10-CM | POA: Diagnosis not present

## 2018-09-03 DIAGNOSIS — Z538 Procedure and treatment not carried out for other reasons: Secondary | ICD-10-CM | POA: Diagnosis not present

## 2018-09-03 DIAGNOSIS — F329 Major depressive disorder, single episode, unspecified: Secondary | ICD-10-CM | POA: Diagnosis not present

## 2018-09-03 DIAGNOSIS — I420 Dilated cardiomyopathy: Secondary | ICD-10-CM | POA: Diagnosis not present

## 2018-09-03 DIAGNOSIS — N183 Chronic kidney disease, stage 3 (moderate): Secondary | ICD-10-CM | POA: Diagnosis not present

## 2018-09-03 DIAGNOSIS — F419 Anxiety disorder, unspecified: Secondary | ICD-10-CM | POA: Diagnosis not present

## 2018-09-03 DIAGNOSIS — Z4502 Encounter for adjustment and management of automatic implantable cardiac defibrillator: Secondary | ICD-10-CM | POA: Diagnosis not present

## 2018-09-03 DIAGNOSIS — E785 Hyperlipidemia, unspecified: Secondary | ICD-10-CM | POA: Diagnosis not present

## 2018-09-03 DIAGNOSIS — I421 Obstructive hypertrophic cardiomyopathy: Secondary | ICD-10-CM | POA: Diagnosis not present

## 2018-09-03 DIAGNOSIS — I480 Paroxysmal atrial fibrillation: Secondary | ICD-10-CM | POA: Diagnosis not present

## 2018-09-03 DIAGNOSIS — K219 Gastro-esophageal reflux disease without esophagitis: Secondary | ICD-10-CM | POA: Diagnosis not present

## 2018-09-03 DIAGNOSIS — Z8249 Family history of ischemic heart disease and other diseases of the circulatory system: Secondary | ICD-10-CM | POA: Diagnosis not present

## 2018-09-03 DIAGNOSIS — I130 Hypertensive heart and chronic kidney disease with heart failure and stage 1 through stage 4 chronic kidney disease, or unspecified chronic kidney disease: Secondary | ICD-10-CM | POA: Diagnosis not present

## 2018-09-03 DIAGNOSIS — J9811 Atelectasis: Secondary | ICD-10-CM | POA: Diagnosis not present

## 2018-10-15 DIAGNOSIS — Z4502 Encounter for adjustment and management of automatic implantable cardiac defibrillator: Secondary | ICD-10-CM | POA: Diagnosis not present

## 2018-10-18 DIAGNOSIS — F4312 Post-traumatic stress disorder, chronic: Secondary | ICD-10-CM | POA: Diagnosis not present

## 2018-10-26 DIAGNOSIS — I4892 Unspecified atrial flutter: Secondary | ICD-10-CM | POA: Diagnosis not present

## 2018-10-26 DIAGNOSIS — I421 Obstructive hypertrophic cardiomyopathy: Secondary | ICD-10-CM | POA: Diagnosis not present

## 2018-10-26 DIAGNOSIS — I420 Dilated cardiomyopathy: Secondary | ICD-10-CM | POA: Diagnosis not present

## 2018-10-29 DIAGNOSIS — I4892 Unspecified atrial flutter: Secondary | ICD-10-CM | POA: Diagnosis not present

## 2018-10-29 DIAGNOSIS — Z01810 Encounter for preprocedural cardiovascular examination: Secondary | ICD-10-CM | POA: Diagnosis not present

## 2018-10-29 DIAGNOSIS — I420 Dilated cardiomyopathy: Secondary | ICD-10-CM | POA: Diagnosis not present

## 2018-10-29 DIAGNOSIS — Z1159 Encounter for screening for other viral diseases: Secondary | ICD-10-CM | POA: Diagnosis not present

## 2018-10-29 DIAGNOSIS — Z01812 Encounter for preprocedural laboratory examination: Secondary | ICD-10-CM | POA: Diagnosis not present

## 2018-10-29 DIAGNOSIS — Z7901 Long term (current) use of anticoagulants: Secondary | ICD-10-CM | POA: Diagnosis not present

## 2018-10-29 DIAGNOSIS — I421 Obstructive hypertrophic cardiomyopathy: Secondary | ICD-10-CM | POA: Diagnosis not present

## 2018-11-01 DIAGNOSIS — Z01810 Encounter for preprocedural cardiovascular examination: Secondary | ICD-10-CM | POA: Diagnosis not present

## 2018-11-01 DIAGNOSIS — Z1159 Encounter for screening for other viral diseases: Secondary | ICD-10-CM | POA: Diagnosis not present

## 2018-11-01 DIAGNOSIS — Z7901 Long term (current) use of anticoagulants: Secondary | ICD-10-CM | POA: Diagnosis not present

## 2018-11-01 DIAGNOSIS — I4892 Unspecified atrial flutter: Secondary | ICD-10-CM | POA: Diagnosis not present

## 2018-11-01 DIAGNOSIS — Z01812 Encounter for preprocedural laboratory examination: Secondary | ICD-10-CM | POA: Diagnosis not present

## 2018-11-01 DIAGNOSIS — I421 Obstructive hypertrophic cardiomyopathy: Secondary | ICD-10-CM | POA: Diagnosis not present

## 2018-11-01 DIAGNOSIS — I4891 Unspecified atrial fibrillation: Secondary | ICD-10-CM | POA: Diagnosis not present

## 2018-11-01 DIAGNOSIS — I429 Cardiomyopathy, unspecified: Secondary | ICD-10-CM | POA: Diagnosis not present

## 2018-11-01 DIAGNOSIS — I420 Dilated cardiomyopathy: Secondary | ICD-10-CM | POA: Diagnosis not present

## 2018-12-14 DIAGNOSIS — I421 Obstructive hypertrophic cardiomyopathy: Secondary | ICD-10-CM | POA: Diagnosis not present

## 2018-12-14 DIAGNOSIS — I420 Dilated cardiomyopathy: Secondary | ICD-10-CM | POA: Diagnosis not present

## 2018-12-14 DIAGNOSIS — I4819 Other persistent atrial fibrillation: Secondary | ICD-10-CM | POA: Diagnosis not present

## 2018-12-14 DIAGNOSIS — I483 Typical atrial flutter: Secondary | ICD-10-CM | POA: Diagnosis not present

## 2018-12-14 DIAGNOSIS — I10 Essential (primary) hypertension: Secondary | ICD-10-CM | POA: Diagnosis not present

## 2018-12-20 DIAGNOSIS — I421 Obstructive hypertrophic cardiomyopathy: Secondary | ICD-10-CM | POA: Diagnosis not present

## 2018-12-20 DIAGNOSIS — R0602 Shortness of breath: Secondary | ICD-10-CM | POA: Diagnosis not present

## 2018-12-20 DIAGNOSIS — I4819 Other persistent atrial fibrillation: Secondary | ICD-10-CM | POA: Diagnosis not present

## 2018-12-20 DIAGNOSIS — Z01812 Encounter for preprocedural laboratory examination: Secondary | ICD-10-CM | POA: Diagnosis not present

## 2018-12-20 DIAGNOSIS — Z79899 Other long term (current) drug therapy: Secondary | ICD-10-CM | POA: Diagnosis not present

## 2018-12-20 DIAGNOSIS — Z20828 Contact with and (suspected) exposure to other viral communicable diseases: Secondary | ICD-10-CM | POA: Diagnosis not present

## 2018-12-24 DIAGNOSIS — R0602 Shortness of breath: Secondary | ICD-10-CM | POA: Diagnosis not present

## 2018-12-24 DIAGNOSIS — Z20828 Contact with and (suspected) exposure to other viral communicable diseases: Secondary | ICD-10-CM | POA: Diagnosis not present

## 2018-12-24 DIAGNOSIS — Z01812 Encounter for preprocedural laboratory examination: Secondary | ICD-10-CM | POA: Diagnosis not present

## 2018-12-24 DIAGNOSIS — I480 Paroxysmal atrial fibrillation: Secondary | ICD-10-CM | POA: Diagnosis not present

## 2018-12-24 DIAGNOSIS — I421 Obstructive hypertrophic cardiomyopathy: Secondary | ICD-10-CM | POA: Diagnosis not present

## 2018-12-24 DIAGNOSIS — I4891 Unspecified atrial fibrillation: Secondary | ICD-10-CM | POA: Diagnosis not present

## 2018-12-24 DIAGNOSIS — Z79899 Other long term (current) drug therapy: Secondary | ICD-10-CM | POA: Diagnosis not present

## 2018-12-24 DIAGNOSIS — I4819 Other persistent atrial fibrillation: Secondary | ICD-10-CM | POA: Diagnosis not present

## 2018-12-28 DIAGNOSIS — I4891 Unspecified atrial fibrillation: Secondary | ICD-10-CM | POA: Diagnosis not present

## 2018-12-28 DIAGNOSIS — I472 Ventricular tachycardia: Secondary | ICD-10-CM | POA: Diagnosis not present

## 2018-12-28 DIAGNOSIS — I420 Dilated cardiomyopathy: Secondary | ICD-10-CM | POA: Diagnosis not present

## 2018-12-28 DIAGNOSIS — Z95810 Presence of automatic (implantable) cardiac defibrillator: Secondary | ICD-10-CM | POA: Diagnosis not present

## 2018-12-28 DIAGNOSIS — I421 Obstructive hypertrophic cardiomyopathy: Secondary | ICD-10-CM | POA: Diagnosis not present

## 2018-12-28 DIAGNOSIS — I340 Nonrheumatic mitral (valve) insufficiency: Secondary | ICD-10-CM | POA: Diagnosis not present

## 2018-12-28 DIAGNOSIS — I4819 Other persistent atrial fibrillation: Secondary | ICD-10-CM | POA: Diagnosis not present

## 2019-01-18 DIAGNOSIS — I420 Dilated cardiomyopathy: Secondary | ICD-10-CM | POA: Diagnosis not present
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
## 2019-04-06 ENCOUNTER — Other Ambulatory Visit: Payer: Self-pay

## 2019-04-25 DIAGNOSIS — I420 Dilated cardiomyopathy: Secondary | ICD-10-CM | POA: Diagnosis not present

## 2019-05-06 ENCOUNTER — Emergency Department (HOSPITAL_COMMUNITY): Payer: Medicare Other

## 2019-05-06 ENCOUNTER — Encounter (HOSPITAL_COMMUNITY): Payer: Self-pay

## 2019-05-06 ENCOUNTER — Other Ambulatory Visit: Payer: Self-pay

## 2019-05-06 ENCOUNTER — Inpatient Hospital Stay (HOSPITAL_COMMUNITY)
Admission: EM | Admit: 2019-05-06 | Discharge: 2019-05-08 | DRG: 291 | Disposition: A | Discharge status: Home / Self Care | Payer: Medicare Other | Attending: Internal Medicine | Admitting: Internal Medicine

## 2019-05-06 DIAGNOSIS — Z79899 Other long term (current) drug therapy: Secondary | ICD-10-CM

## 2019-05-06 DIAGNOSIS — Z79891 Long term (current) use of opiate analgesic: Secondary | ICD-10-CM

## 2019-05-06 DIAGNOSIS — J9601 Acute respiratory failure with hypoxia: Secondary | ICD-10-CM

## 2019-05-06 DIAGNOSIS — I110 Hypertensive heart disease with heart failure: Secondary | ICD-10-CM | POA: Diagnosis not present

## 2019-05-06 DIAGNOSIS — R0602 Shortness of breath: Secondary | ICD-10-CM | POA: Diagnosis not present

## 2019-05-06 DIAGNOSIS — Z20828 Contact with and (suspected) exposure to other viral communicable diseases: Secondary | ICD-10-CM | POA: Diagnosis present

## 2019-05-06 DIAGNOSIS — Z9114 Patient's other noncompliance with medication regimen: Secondary | ICD-10-CM

## 2019-05-06 DIAGNOSIS — I509 Heart failure, unspecified: Secondary | ICD-10-CM

## 2019-05-06 DIAGNOSIS — I482 Chronic atrial fibrillation, unspecified: Secondary | ICD-10-CM

## 2019-05-06 DIAGNOSIS — Z95810 Presence of automatic (implantable) cardiac defibrillator: Secondary | ICD-10-CM

## 2019-05-06 DIAGNOSIS — Z880 Allergy status to penicillin: Secondary | ICD-10-CM

## 2019-05-06 DIAGNOSIS — R0603 Acute respiratory distress: Secondary | ICD-10-CM | POA: Diagnosis not present

## 2019-05-06 DIAGNOSIS — I5031 Acute diastolic (congestive) heart failure: Secondary | ICD-10-CM | POA: Diagnosis present

## 2019-05-06 DIAGNOSIS — I2510 Atherosclerotic heart disease of native coronary artery without angina pectoris: Secondary | ICD-10-CM

## 2019-05-06 DIAGNOSIS — I10 Essential (primary) hypertension: Secondary | ICD-10-CM

## 2019-05-06 DIAGNOSIS — Z7901 Long term (current) use of anticoagulants: Secondary | ICD-10-CM

## 2019-05-06 DIAGNOSIS — E785 Hyperlipidemia, unspecified: Secondary | ICD-10-CM

## 2019-05-06 DIAGNOSIS — Z8249 Family history of ischemic heart disease and other diseases of the circulatory system: Secondary | ICD-10-CM

## 2019-05-06 DIAGNOSIS — Z91148 Patient's other noncompliance with medication regimen for other reason: Secondary | ICD-10-CM

## 2019-05-06 DIAGNOSIS — F4310 Post-traumatic stress disorder, unspecified: Secondary | ICD-10-CM | POA: Diagnosis present

## 2019-05-06 DIAGNOSIS — Z7289 Other problems related to lifestyle: Secondary | ICD-10-CM

## 2019-05-06 LAB — CBC WITH DIFFERENTIAL/PLATELET
Abs Immature Granulocytes: 0.02 10*3/uL (ref 0.00–0.07)
Basophils Absolute: 0.1 10*3/uL (ref 0.0–0.1)
Basophils Relative: 1 %
Eosinophils Absolute: 0.2 10*3/uL (ref 0.0–0.5)
Eosinophils Relative: 2 %
HCT: 50.1 % (ref 39.0–52.0)
Hemoglobin: 16.4 g/dL (ref 13.0–17.0)
Immature Granulocytes: 0 %
Lymphocytes Relative: 48 %
Lymphs Abs: 4.7 10*3/uL — ABNORMAL HIGH (ref 0.7–4.0)
MCH: 32.3 pg (ref 26.0–34.0)
MCHC: 32.7 g/dL (ref 30.0–36.0)
MCV: 98.8 fL (ref 80.0–100.0)
Monocytes Absolute: 1 10*3/uL (ref 0.1–1.0)
Monocytes Relative: 10 %
Neutro Abs: 3.7 10*3/uL (ref 1.7–7.7)
Neutrophils Relative %: 39 %
Platelets: 205 10*3/uL (ref 150–400)
RBC: 5.07 MIL/uL (ref 4.22–5.81)
RDW: 12.9 % (ref 11.5–15.5)
WBC: 9.6 10*3/uL (ref 4.0–10.5)
nRBC: 0 % (ref 0.0–0.2)

## 2019-05-06 MED ORDER — ALBUTEROL SULFATE HFA 108 (90 BASE) MCG/ACT IN AERS
2.0000 | INHALATION_SPRAY | Freq: Four times a day (QID) | RESPIRATORY_TRACT | Status: DC
  Administered 2019-05-06: 2 via RESPIRATORY_TRACT
  Filled 2019-05-06: qty 6.7

## 2019-05-06 MED ORDER — DEXAMETHASONE SODIUM PHOSPHATE 10 MG/ML IJ SOLN
10.0000 mg | Freq: Once | INTRAMUSCULAR | Status: AC
  Administered 2019-05-06: 10 mg via INTRAVENOUS
  Filled 2019-05-06: qty 1

## 2019-05-06 NOTE — ED Provider Notes (Signed)
[facility] EMERGENCY DEPARTMENT Provider Note   CSN: [id number] Arrival date & time: 05/06/19  2323     History Chief Complaint  Patient presents with  . Shortness of Breath    Walter Raymond is a 73 y.o. male.  HPI    Patient presents in respiratory distress via EMS. Patient is wearing a nonrebreather mask, answers questions only briefly, but seemingly appropriately. Per patient and EMS, the patient was in his usual state of health until about 90 minutes ago per About that time he developed dyspnea. He has seemingly been taking all medication as directed including Lasix. He does not wear home oxygen Does have a history of heart failure. He denies fever, current chest pain though he did have some earlier. Since onset, he has had persistent dyspnea, mildly improved with sublingual nitroglycerin, provision of CPAP. EMS providers note that the patient was hypertensive with hypoxia, with a saturation in the 80% range on their arrival, though this improved with supplemental oxygen and therapy.  Past Medical History:  Diagnosis Date  . A-fib ([place])   . Coronary artery disease   . History of cardioversion   . Hypercholesteremia   . Pacemaker   . PTSD (post-traumatic stress disorder)   . S/P cardiac cath 11/18/2013    There are no problems to display for this patient.   Past Surgical History:  Procedure Laterality Date  . CARDIAC DEFIBRILLATOR PLACEMENT    . CARDIAC DEFIBRILLATOR PLACEMENT    . PACEMAKER IMPLANT         Family History  Problem Relation Age of Onset  . Heart failure Mother     Social History   Tobacco Use  . Smoking status: Never Smoker  . Smokeless tobacco: Never Used  Substance Use Topics  . Alcohol use: Yes    Alcohol/week: 21.0 standard drinks    Types: 21 Cans of beer per week    Comment: occ  . Drug use: No    Home Medications Prior to Admission medications   Medication Sig Start Date End Date Taking? Authorizing Provider    FLUoxetine (PROZAC) 40 MG capsule Take 40 mg by mouth daily. 12/14/13   [provider]  furosemide (LASIX) 40 MG tablet Take 1 tablet by mouth daily. 12/15/16   [provider]  HYDROcodone-acetaminophen (NORCO/VICODIN) 5-325 MG tablet Take 1 tablet by mouth every 6 (six) hours as needed for severe pain. 11/04/17   Ripley Fraise, MD  lisinopril (PRINIVIL,ZESTRIL) 10 MG tablet Take 10 mg by mouth daily. 02/09/14 02/09/15  [provider]  lisinopril (PRINIVIL,ZESTRIL) 2.5 MG tablet Take 1 tablet by mouth daily. 01/30/17   [provider]  meloxicam (MOBIC) 7.5 MG tablet Take 7.5 mg by mouth every morning.    [provider]  metoprolol succinate (TOPROL-XL) 25 MG 24 hr tablet Take 1 tablet by mouth daily. 09/26/16   [provider]  nabumetone (RELAFEN) 500 MG tablet Take 500 mg by mouth as directed.    [provider]  nitroGLYCERIN (NITROSTAT) 0.4 MG SL tablet Place under the tongue. 08/24/16 08/24/17  [provider]  pravastatin (PRAVACHOL) 20 MG tablet Take 1 tablet by mouth daily. 02/18/17   [provider]  ranitidine (ZANTAC) 150 MG tablet Take 150 mg by mouth at bedtime. 06/20/14   [provider]  simvastatin (ZOCOR) 40 MG tablet Take 40 mg by mouth at bedtime.    [provider]  sotalol (BETAPACE) 120 MG tablet Take 1 tablet by mouth 2 (  two) times daily. 01/28/17   [provider]  sotalol (BETAPACE) 80 MG tablet Take 40 mg by mouth 2 (two) times daily. 01/26/15   [provider]  traZODone (DESYREL) 50 MG tablet Take 50 mg by mouth at bedtime. 01/18/15   [provider]  warfarin (COUMADIN) 5 MG tablet Take 1 tablet by mouth daily. 01/14/17   [provider]    Allergies    Penicillins  Review of Systems   Review of Systems  Constitutional:       Per HPI, otherwise negative  HENT:       Per HPI, otherwise negative  Respiratory:       Per HPI, otherwise  negative  Cardiovascular:       Per HPI, otherwise negative  Gastrointestinal: Negative for vomiting.  Endocrine:       Negative aside from HPI  Genitourinary:       Neg aside from HPI   Musculoskeletal:       Per HPI, otherwise negative  Skin: Negative.   Neurological: Negative for syncope.    Physical Exam Updated Vital Signs BP 94/64   Pulse 77   Resp (!) 21   Ht 5\' 8"  (1.727 m)   Wt 78 kg   SpO2 100%   BMI 26.15 kg/m   Physical Exam Vitals and nursing note reviewed.  Constitutional:      General: He is in acute distress.     Appearance: He is well-developed. He is ill-appearing and diaphoretic.  HENT:     Head: Normocephalic and atraumatic.  Eyes:     Conjunctiva/sclera: Conjunctivae normal.  Cardiovascular:     Rate and Rhythm: Regular rhythm. Tachycardia present.  Pulmonary:     Effort: Tachypnea, accessory muscle usage and respiratory distress present.     Breath sounds: No stridor. Decreased breath sounds and rhonchi present.  Abdominal:     General: There is no distension.  Skin:    General: Skin is warm.  Neurological:     Mental Status: He is alert and oriented to person, place, and time.     ED Results / Procedures / Treatments   Labs (all labs ordered are listed, but only abnormal results are displayed) Labs Reviewed  COMPREHENSIVE METABOLIC PANEL - Abnormal; Notable for the following components:      Result Value   Glucose, Bld 173 (*)    BUN 24 (*)    Calcium 8.8 (*)    All other components within normal limits  CBC WITH DIFFERENTIAL/PLATELET - Abnormal; Notable for the following components:   Lymphs Abs 4.7 (*)    All other components within normal limits  BRAIN NATRIURETIC PEPTIDE - Abnormal; Notable for the following components:   B Natriuretic Peptide 706.0 (*)    All other components within normal limits  URINALYSIS, ROUTINE W REFLEX MICROSCOPIC - Abnormal; Notable for the following components:   Color, Urine STRAW (*)    All other  components within normal limits  PROTIME-INR - Abnormal; Notable for the following components:   Prothrombin Time 19.2 (*)    INR 1.6 (*)    All other components within normal limits  RESPIRATORY PANEL BY RT PCR (FLU A&B, COVID)  POC SARS CORONAVIRUS 2 AG -  ED    EKG EKG Interpretation  Date/Time:  Friday May 06 2019 23:29:24 EST Ventricular Rate:  91 PR Interval:    QRS Duration: 182 QT Interval:  445 QTC Calculation: 548 R Axis:   -85 Text Interpretation:  Sinus rhythm Left atrial enlargement Right bundle branch block LVH with IVCD and secondary repol abnrm Prolonged QT interval No significant change since last tracing Abnormal ECG Confirmed by Carmin Muskrat [phone]) on 05/06/2019 11:34:06 PM   Radiology DG Chest Port 1 View  Result Date: 05/06/2019 CLINICAL DATA:  Shortness of breath EXAM: PORTABLE CHEST 1 VIEW COMPARISON:  None. FINDINGS: Left chest wall AICD leads in expected position. The lungs are hyperinflated with diffuse interstitial prominence. No focal airspace consolidation or pulmonary edema. Left basilar atelectasis. No pleural effusion or pneumothorax. Normal cardiomediastinal contours. IMPRESSION: Left lower lobe atelectasis. Mild diffuse interstitial coarsening may indicate COPD. Electronically Signed   By: Ulyses Jarred M.D.   On: 05/06/2019 23:56    Procedures Procedures (including critical care time)  CRITICAL CARE Performed by: Carmin Muskrat Total critical care time: 35 minutes Critical care time was exclusive of separately billable procedures and treating other patients. Critical care was necessary to treat or prevent imminent or life-threatening deterioration. Critical care was time spent personally by me on the following activities: development of treatment plan with patient and/or surrogate as well as nursing, discussions with consultants, evaluation of patient's response to treatment, examination of patient, obtaining history from patient or  surrogate, ordering and performing treatments and interventions, ordering and review of laboratory studies, ordering and review of radiographic studies, pulse oximetry and re-evaluation of patient's condition.   Medications Ordered in ED Medications  albuterol (VENTOLIN HFA) 108 (90 Base) MCG/ACT inhaler 2 puff (2 puffs Inhalation Not Given 05/07/19 0111)  dexamethasone (DECADRON) injection 10 mg (10 mg Intravenous Given 05/06/19 2339)  nitroGLYCERIN (NITROGLYN) 2 % ointment 0.5 inch (0.5 inches Topical Given 05/07/19 0019)  furosemide (LASIX) injection 20 mg (20 mg Intravenous Given 05/07/19 0018)    ED Course  I have reviewed the triage vital signs and the nursing notes.  Pertinent labs & imaging results that were available during my care of the patient were reviewed by me and considered in my medical decision making (see chart for details).    MDM Rules/Calculators/A&P                     Immediately after arrival with concern for respiratory distress patient was started on BiPAP, stat Covid test sent. With consideration of flash pulmonary edema, patient was received nitroglycerin paste, albuterol, Lasix, Decadron.  Update: Patient substantially better, no longer diaphoretic.   2:24 AM Patient now only on 4 L via nasal cannula, substantially better than on arrival, respiratory distress has resolved, but he continues to require oxygen. As above, he has received Lasix, Decadron, albuterol, Nitropaste. Pressure substantially better He notes that he takes Lasix only as needed, and has not done so recently. He states that he does not wear oxygen at home, and this represents a new oxygen requirement. With consideration of this, consideration of mixed etiology pulmonary edema, likely secondary to hypertensive urgency, and fluid overload status, patient will require admission for further monitoring, management, therapy. Covid test negative. Final Clinical Impression(s) / ED  Diagnoses Final diagnoses:  Respiratory distress     Carmin Muskrat, MD 05/07/19 [phone]

## 2019-05-06 NOTE — ED Triage Notes (Signed)
Pt arrived via [facility] from home. Pt /co SOB., 81% RA. 35RR, put on C-PAP. Tripod position on scene. Rales. Spiting up pink sputum. Does take lasix daily.  97% with ems. Hx of pulmonary edema.

## 2019-05-06 NOTE — ED Notes (Signed)
Wife said that he has been complaining of sob all day but then suddenly became worse.   Walter Raymond [phone]

## 2019-05-07 ENCOUNTER — Inpatient Hospital Stay (HOSPITAL_COMMUNITY): Payer: Medicare Other

## 2019-05-07 ENCOUNTER — Other Ambulatory Visit (HOSPITAL_COMMUNITY): Payer: Medicare Other

## 2019-05-07 DIAGNOSIS — Z9114 Patient's other noncompliance with medication regimen: Secondary | ICD-10-CM

## 2019-05-07 DIAGNOSIS — I2510 Atherosclerotic heart disease of native coronary artery without angina pectoris: Secondary | ICD-10-CM

## 2019-05-07 DIAGNOSIS — I10 Essential (primary) hypertension: Secondary | ICD-10-CM

## 2019-05-07 DIAGNOSIS — Z8249 Family history of ischemic heart disease and other diseases of the circulatory system: Secondary | ICD-10-CM | POA: Diagnosis not present

## 2019-05-07 DIAGNOSIS — J9601 Acute respiratory failure with hypoxia: Secondary | ICD-10-CM

## 2019-05-07 DIAGNOSIS — Z79891 Long term (current) use of opiate analgesic: Secondary | ICD-10-CM | POA: Diagnosis not present

## 2019-05-07 DIAGNOSIS — I509 Heart failure, unspecified: Secondary | ICD-10-CM | POA: Diagnosis not present

## 2019-05-07 DIAGNOSIS — Z7901 Long term (current) use of anticoagulants: Secondary | ICD-10-CM | POA: Diagnosis not present

## 2019-05-07 DIAGNOSIS — I482 Chronic atrial fibrillation, unspecified: Secondary | ICD-10-CM

## 2019-05-07 DIAGNOSIS — E785 Hyperlipidemia, unspecified: Secondary | ICD-10-CM | POA: Diagnosis present

## 2019-05-07 DIAGNOSIS — Z20828 Contact with and (suspected) exposure to other viral communicable diseases: Secondary | ICD-10-CM | POA: Diagnosis present

## 2019-05-07 DIAGNOSIS — I5031 Acute diastolic (congestive) heart failure: Secondary | ICD-10-CM

## 2019-05-07 DIAGNOSIS — Z880 Allergy status to penicillin: Secondary | ICD-10-CM | POA: Diagnosis not present

## 2019-05-07 DIAGNOSIS — Z95810 Presence of automatic (implantable) cardiac defibrillator: Secondary | ICD-10-CM | POA: Diagnosis not present

## 2019-05-07 DIAGNOSIS — F4310 Post-traumatic stress disorder, unspecified: Secondary | ICD-10-CM | POA: Diagnosis present

## 2019-05-07 DIAGNOSIS — Z79899 Other long term (current) drug therapy: Secondary | ICD-10-CM | POA: Diagnosis not present

## 2019-05-07 DIAGNOSIS — Z7289 Other problems related to lifestyle: Secondary | ICD-10-CM | POA: Diagnosis not present

## 2019-05-07 DIAGNOSIS — I110 Hypertensive heart disease with heart failure: Secondary | ICD-10-CM | POA: Diagnosis present

## 2019-05-07 LAB — URINALYSIS, ROUTINE W REFLEX MICROSCOPIC
Bilirubin Urine: NEGATIVE
Glucose, UA: NEGATIVE mg/dL
Hgb urine dipstick: NEGATIVE
Ketones, ur: NEGATIVE mg/dL
Leukocytes,Ua: NEGATIVE
Nitrite: NEGATIVE
Protein, ur: NEGATIVE mg/dL
Specific Gravity, Urine: 1.008 (ref 1.005–1.030)
pH: 5 (ref 5.0–8.0)

## 2019-05-07 LAB — COMPREHENSIVE METABOLIC PANEL
ALT: 19 U/L (ref 0–44)
AST: 21 U/L (ref 15–41)
Albumin: 3.9 g/dL (ref 3.5–5.0)
Alkaline Phosphatase: 69 U/L (ref 38–126)
Anion gap: 9 (ref 5–15)
BUN: 24 mg/dL — ABNORMAL HIGH (ref 8–23)
CO2: 26 mmol/L (ref 22–32)
Calcium: 8.8 mg/dL — ABNORMAL LOW (ref 8.9–10.3)
Chloride: 102 mmol/L (ref 98–111)
Creatinine, Ser: 1.11 mg/dL (ref 0.61–1.24)
GFR calc Af Amer: >60 mL/min (ref >60)
GFR calc non Af Amer: >60 mL/min (ref >60)
Glucose, Bld: 173 mg/dL — ABNORMAL HIGH (ref 70–99)
Potassium: 4.7 mmol/L (ref 3.5–5.1)
Sodium: 137 mmol/L (ref 135–145)
Total Bilirubin: 0.7 mg/dL (ref 0.3–1.2)
Total Protein: 7.6 g/dL (ref 6.5–8.1)

## 2019-05-07 LAB — ECHOCARDIOGRAM COMPLETE
Height: 68 in
Weight: 2751.34 oz

## 2019-05-07 LAB — RESPIRATORY PANEL BY RT PCR (FLU A&B, COVID)
Influenza A by PCR: NEGATIVE
Influenza B by PCR: NEGATIVE
SARS Coronavirus 2 by RT PCR: NEGATIVE

## 2019-05-07 LAB — PROTIME-INR
INR: 1.6 — ABNORMAL HIGH (ref 0.8–1.2)
Prothrombin Time: 19.2 seconds — ABNORMAL HIGH (ref 11.4–15.2)

## 2019-05-07 LAB — BRAIN NATRIURETIC PEPTIDE: B Natriuretic Peptide: 706 pg/mL — ABNORMAL HIGH (ref 0.0–100.0)

## 2019-05-07 MED ORDER — ACETAMINOPHEN 325 MG PO TABS: 650.0000 mg | ORAL_TABLET | ORAL | Status: DC | PRN

## 2019-05-07 MED ORDER — ONDANSETRON HCL 4 MG/2ML IJ SOLN: 4.0000 mg | Freq: Four times a day (QID) | INTRAMUSCULAR | Status: DC | PRN

## 2019-05-07 MED ORDER — SODIUM CHLORIDE 0.9% FLUSH: 3.0000 mL | Freq: Two times a day (BID) | INTRAVENOUS | Status: DC

## 2019-05-07 MED ORDER — LISINOPRIL 10 MG PO TABS
10.0000 mg | ORAL_TABLET | Freq: Every day | ORAL | Status: DC
  Administered 2019-05-07: 10:00:00 10 mg via ORAL
  Filled 2019-05-07: qty 1

## 2019-05-07 MED ORDER — FUROSEMIDE 40 MG PO TABS
40.0000 mg | ORAL_TABLET | Freq: Every day | ORAL | Status: DC
  Administered 2019-05-07: 11:00:00 40 mg via ORAL
  Filled 2019-05-07: qty 1

## 2019-05-07 MED ORDER — SODIUM CHLORIDE 0.9 % IV SOLN: 250.0000 mL | INTRAVENOUS | Status: DC | PRN

## 2019-05-07 MED ORDER — METOPROLOL SUCCINATE ER 25 MG PO TB24
25.0000 mg | ORAL_TABLET | Freq: Every day | ORAL | Status: DC
  Administered 2019-05-07: 14:00:00 25 mg via ORAL
  Filled 2019-05-07: qty 1

## 2019-05-07 MED ORDER — RIVAROXABAN 20 MG PO TABS
20.0000 mg | ORAL_TABLET | Freq: Every day | ORAL | Status: DC
  Administered 2019-05-07: 20 mg via ORAL
  Filled 2019-05-07 (×2): qty 1

## 2019-05-07 MED ORDER — NITROGLYCERIN 2 % TD OINT
0.5000 [in_us] | TOPICAL_OINTMENT | Freq: Once | TRANSDERMAL | Status: AC
  Administered 2019-05-07: 0.5 [in_us] via TOPICAL
  Filled 2019-05-07: qty 1

## 2019-05-07 MED ORDER — CITALOPRAM HYDROBROMIDE 20 MG PO TABS
20.0000 mg | ORAL_TABLET | Freq: Every day | ORAL | Status: DC
  Administered 2019-05-07: 15:00:00 20 mg via ORAL
  Filled 2019-05-07 (×3): qty 1

## 2019-05-07 MED ORDER — FUROSEMIDE 10 MG/ML IJ SOLN
20.0000 mg | Freq: Once | INTRAMUSCULAR | Status: AC
  Administered 2019-05-07: 20 mg via INTRAVENOUS
  Filled 2019-05-07: qty 2

## 2019-05-07 MED ORDER — ALBUTEROL SULFATE HFA 108 (90 BASE) MCG/ACT IN AERS: 2.0000 | INHALATION_SPRAY | Freq: Four times a day (QID) | RESPIRATORY_TRACT | Status: DC | PRN

## 2019-05-07 MED ORDER — SOTALOL HCL 80 MG PO TABS
120.0000 mg | ORAL_TABLET | Freq: Two times a day (BID) | ORAL | Status: DC
  Administered 2019-05-07 (×2): 120 mg via ORAL
  Filled 2019-05-07 (×5): qty 1.5

## 2019-05-07 MED ORDER — SODIUM CHLORIDE 0.9% FLUSH: 3.0000 mL | INTRAVENOUS | Status: DC | PRN

## 2019-05-07 NOTE — ED Notes (Signed)
Patient placed on 4 lpm nasal cannula off BiPAP

## 2019-05-07 NOTE — Progress Notes (Signed)
*  PRELIMINARY RESULTS* Echocardiogram 2D Echocardiogram has been performed.  Walter Raymond 05/07/2019, 10:06 AM

## 2019-05-07 NOTE — Progress Notes (Signed)
Subjective: This man was admitted with congestive heart failure last night.  His oxygen saturation was in the 80s and required supplemental oxygen via NRB.  He is received intravenous Lasix and has done well and feels much improved.  He tells me that he had not taken his daily Lasix for the last couple of days.  He denies any chest pain.  He is currently receiving an echocardiogram.   Objective: Vital signs in last 24 hours: Temp:  [98 F (36.7 C)] 98 F (36.7 C) (12/26 0230) Pulse Rate:  [31-95] 84 (12/26 0830) Resp:  [15-33] 15 (12/26 0830) BP: (94-169)/(61-100) 118/76 (12/26 0830) SpO2:  [95 %-100 %] 100 % (12/26 0857) FiO2 (%):  [50 %] 50 % (12/25 2357) Weight:  [78 kg] 78 kg (12/25 2327) He is on no respiratory distress.  Saturations are good.  No new other physical findings. Intake/Output from previous day: No intake/output data recorded. Intake/Output this shift: Total I/O In: -  Out: 400 [Urine:400]  Recent Labs    05/06/19 2331  HGB 16.4   Recent Labs    05/06/19 2331  WBC 9.6  RBC 5.07  HCT 50.1  PLT 205   Recent Labs    05/06/19 2331  NA 137  K 4.7  CL 102  CO2 26  BUN 24*  CREATININE 1.11  GLUCOSE 173*  CALCIUM 8.8*     Assessment/Plan: 1.  Acute congestive heart failure.  Continue with oral daily Lasix now. 2.  Possible discharge tomorrow depending on clinical status.  He wanted to go home now but I have told him he needs to stay for another 24 hours.  I think at the moment he is agreeable to stay but I told him that if he wishes to leave, he will have to sign paperwork that he is leaving [place].   Hau Sanor C Shaye Lagace 05/07/2019, 10:01 AM

## 2019-05-07 NOTE — ED Notes (Signed)
O2 sat 100%. Oxygen d/c.

## 2019-05-07 NOTE — H&P (Addendum)
History and Physical  IAM SENDER [id number] DOB: 03-15-1946 DOA: 05/06/2019  Referring physician: Carmin Muskrat PCP: Manon Hilding, MD  Patient coming from: Home  Chief Complaint: Shortness of breath  HPI: Walter Raymond is a 73 y.o. male with medical history significant for A. fib on Xarelto, CAD, AICD, hyperlipidemia who presents to the emergency department from home after sudden onset of shortness of breath at rest which occurred within 2 hours of presenting to the ED via EMS.  Patient states that he had no complaints prior to onset of symptoms and that he was in his normal baseline of functioning.  Patient complained of cough with production of pink frothy sputum.  EMS was called, he was noted to be hypotensive with O2 sats in the 80s on arrival, he was provided with supplemental oxygen via NRB with improved oxygenation (of note, patient does not wear oxygen at baseline).  Patient states that he has been compliant with his medications, though he only takes his Lasix as needed and he has not done so recently. He denies chest pain, fever, chills.  ED Course:  He was initially tachypneic, but other vital signs were within normal range. BNP was 706, CBC and BMP were normal except for hyperglycemia and BUN of 24, urinalysis was negative.  COVID-19 test, influenza A & B were negative.  Chest x-ray showed left lower lobe atelectasis with mild diffuse interstitial coarsening that may indicate COPD.  IV Lasix 20 mg, nitroglycerin patch and IV Decadron were given with improved symptoms, he was initially on BiPAP and eventually transitioned to supplemental oxygen via [place] at 4 LPM.  Hospitalist was asked to admit patient for further evaluation and management.  Review of Systems: Constitutional: Negative for chills and fever.  HENT: Negative for ear pain and sore throat.   Eyes: Negative for pain and visual disturbance.  Respiratory: cough with pink frothy sputum and shortness of  breath Cardiovascular: Negative for chest pain and palpitations.  Gastrointestinal: Negative for abdominal pain and vomiting.  Endocrine: Negative for polyphagia and polyuria.  Genitourinary: Negative for decreased urine volume, dysuria Musculoskeletal: Negative for arthralgias and back pain.  Skin: Negative for color change and rash.  Allergic/Immunologic: Negative for immunocompromised state.  Neurological: Negative for tremors, syncope, speech difficulty, weakness, light-headedness and headaches.  Hematological: Does not bruise/bleed easily.  All other systems reviewed and are negative  Past Medical History:  Diagnosis Date   A-fib [facility])    Coronary artery disease    History of cardioversion    Hypercholesteremia    Pacemaker    PTSD (post-traumatic stress disorder)    S/P cardiac cath 11/18/2013   Past Surgical History:  Procedure Laterality Date   CARDIAC DEFIBRILLATOR PLACEMENT     CARDIAC DEFIBRILLATOR PLACEMENT     PACEMAKER IMPLANT      Social History:  reports that he has never smoked. He has never used smokeless tobacco. He reports current alcohol use of about 21.0 standard drinks of alcohol per week. He reports that he does not use drugs.   Allergies  Allergen Reactions   Penicillins     Family History  Problem Relation Age of Onset   Heart failure Mother       Prior to Admission medications   Medication Sig Start Date End Date Taking? Authorizing Provider  FLUoxetine (PROZAC) 40 MG capsule Take 40 mg by mouth daily. 12/14/13   [provider]  furosemide (LASIX) 40 MG tablet Take 1 tablet by mouth daily. 12/15/16  [provider]  HYDROcodone-acetaminophen (NORCO/VICODIN) 5-325 MG tablet Take 1 tablet by mouth every 6 (six) hours as needed for severe pain. 11/04/17   Ripley Fraise, MD  lisinopril (PRINIVIL,ZESTRIL) 10 MG tablet Take 10 mg by mouth daily. 02/09/14 02/09/15  [provider]  lisinopril (PRINIVIL,ZESTRIL)  2.5 MG tablet Take 1 tablet by mouth daily. 01/30/17   [provider]  meloxicam (MOBIC) 7.5 MG tablet Take 7.5 mg by mouth every morning.    [provider]  metoprolol succinate (TOPROL-XL) 25 MG 24 hr tablet Take 1 tablet by mouth daily. 09/26/16   [provider]  nabumetone (RELAFEN) 500 MG tablet Take 500 mg by mouth as directed.    [provider]  nitroGLYCERIN (NITROSTAT) 0.4 MG SL tablet Place under the tongue. 08/24/16 08/24/17  [provider]  pravastatin (PRAVACHOL) 20 MG tablet Take 1 tablet by mouth daily. 02/18/17   [provider]  ranitidine (ZANTAC) 150 MG tablet Take 150 mg by mouth at bedtime. 06/20/14   [provider]  simvastatin (ZOCOR) 40 MG tablet Take 40 mg by mouth at bedtime.    [provider]  sotalol (BETAPACE) 120 MG tablet Take 1 tablet by mouth 2 (two) times daily. 01/28/17   [provider]  sotalol (BETAPACE) 80 MG tablet Take 40 mg by mouth 2 (two) times daily. 01/26/15   [provider]  traZODone (DESYREL) 50 MG tablet Take 50 mg by mouth at bedtime. 01/18/15   [provider]  warfarin (COUMADIN) 5 MG tablet Take 1 tablet by mouth daily. 01/14/17   [provider]    Physical Exam: BP 124/76    Pulse 77    Temp 98 F (36.7 C)    Resp 16    Ht 5\' 8"  (1.727 m)    Wt 78 kg    SpO2 100%    BMI 26.15 kg/m    General: 73 y.o. year-old male well developed well nourished in no acute distress.  Alert and oriented x3.  HEENT: Normocephalic, atraumatic  NECK: Supple, trachea medial  Cardiovascular: Regular rate and rhythm with no rubs or gallops.  No thyromegaly or JVD noted.  No lower extremity edema. 2/4 pulses in all 4 extremities.  Respiratory: Clear to auscultation with no wheezes or rales. Good inspiratory effort.  Abdomen: Soft nontender nondistended with normal bowel sounds x4 quadrants.  Muskuloskeletal: No cyanosis, clubbing or edema noted  bilaterally  Neuro: CN II-XII intact, strength, sensation, reflexes  Skin: No ulcerative lesions noted or rashes  Psychiatry: Judgement and insight appear normal. Mood is appropriate for condition and setting          Labs on Admission:  Basic Metabolic Panel: Recent Labs  Lab 05/06/19 2331  NA 137  K 4.7  CL 102  CO2 26  GLUCOSE 173*  BUN 24*  CREATININE 1.11  CALCIUM 8.8*   Liver Function Tests: Recent Labs  Lab 05/06/19 2331  AST 21  ALT 19  ALKPHOS 69  BILITOT 0.7  PROT 7.6  ALBUMIN 3.9   No results for input(s): LIPASE, AMYLASE in the last 168 hours. No results for input(s): AMMONIA in the last 168 hours. CBC: Recent Labs  Lab 05/06/19 2331  WBC 9.6  NEUTROABS 3.7  HGB 16.4  HCT 50.1  MCV 98.8  PLT 205   Cardiac Enzymes: No results for input(s): CKTOTAL, CKMB, CKMBINDEX, TROPONINI in the last 168 hours.  BNP (last 3 results) Recent Labs    05/06/19  2331  BNP 706.0*    ProBNP (last 3 results) No results for input(s): PROBNP in the last 8760 hours.  CBG: No results for input(s): GLUCAP in the last 168 hours.  Radiological Exams on Admission: DG Chest Port 1 View  Result Date: 05/06/2019 CLINICAL DATA:  Shortness of breath EXAM: PORTABLE CHEST 1 VIEW COMPARISON:  None. FINDINGS: Left chest wall AICD leads in expected position. The lungs are hyperinflated with diffuse interstitial prominence. No focal airspace consolidation or pulmonary edema. Left basilar atelectasis. No pleural effusion or pneumothorax. Normal cardiomediastinal contours. IMPRESSION: Left lower lobe atelectasis. Mild diffuse interstitial coarsening may indicate COPD. Electronically Signed   By: Ulyses Jarred M.D.   On: 05/06/2019 23:56    EKG: I independently viewed the EKG done and my findings are as followed: Sinus rhythm at a rate of 91bpm, Left atrial enlargement, Right bundle branch block, Prolonged QT interval (546ms). No significant change since last  tracing  Assessment/Plan Present on Admission: **None**  Principal Problem:   Acute respiratory failure with hypoxia (HCC) Active Problems:   Acute CHF (congestive heart failure) (HCC)   Atrial fibrillation, chronic (HCC)   CAD (coronary artery disease)   Essential hypertension   Hyperlipidemia   History of medication noncompliance  Acute respiratory failure with hypoxia in the setting of possible acute CHF BNP was elevated at 706 Continue total input/output, daily weights and fluid restriction Continue IV Lasix 20 twice daily (as tolerated by BP) Continue Cardiac diet  EKG and Echocardiogram in the morning   Atrial fibrillation with AICD placement Continue to monitor patient telemetry Patient states that he takes Xarelto, continue Xarelto when med rec is updated Last review of AICD was several months ago  CAD/essential hypertension It is not certain at this time which medications patient takes for BP at home per med rec, since several same medications have different doses, we shall await an updated med rec  Hyperlipidemia There are 2 different types of statin on patient's med rec, it is not certain which one he takes at this time.  We shall await updated med rec.  Medication noncompliance Patient appears to be noncompliant with his Lasix. Patient will be counseled on importance of being compliant with medication regimen prior to discharge   DVT prophylaxis: Xarelto (patient states he takes Xarelto, this will be started once med rec is updated), SCDs  Code Status: Full code  Family Communication: None at bedside  Disposition Plan: Home once clinically improved  Consults called: None  Admission status: Inpatient  Walter Hoit MD [facility]  If 7PM-7AM, please contact night-coverage [email or web address]  05/07/2019, 7:43 AM

## 2019-05-07 NOTE — ED Notes (Signed)
ED TO INPATIENT HANDOFF REPORT  ED Nurse Name and Phone #: [phone]  S Name/Age/Gender Walter Raymond 73 y.o. male Room/Bed: APA19/APA19  Code Status   Code Status: Full Code  Home/SNF/Other Home Patient oriented to: self Is this baseline? Yes   Triage Complete: Triage complete  Chief Complaint CHF (congestive heart failure) ([place]) [I50.9]  Triage Note Pt arrived via [facility] from home. Pt /co SOB., 81% RA. 35RR, put on C-PAP. Tripod position on scene. Rales. Spiting up pink sputum. Does take lasix daily.  97% with ems. Hx of pulmonary edema.     Allergies Allergies  Allergen Reactions  . Penicillins Hives    Level of Care/Admitting Diagnosis ED Disposition    ED Disposition Condition Walter Raymond Area: [facility] [id number]  Level of Care: Telemetry [5]  Covid Evaluation: Asymptomatic Screening Protocol (No Symptoms)  Diagnosis: CHF (congestive heart failure) [facility] [id number]  Admitting Physician: Bernadette Hoit [id number]  Attending Physician: Bernadette Hoit [id number]  Estimated length of stay: past midnight tomorrow  Certification:: I certify this patient will need inpatient services for at least 2 midnights       B Medical/Surgery History Past Medical History:  Diagnosis Date  . A-fib ([place])   . Coronary artery disease   . History of cardioversion   . Hypercholesteremia   . Pacemaker   . PTSD (post-traumatic stress disorder)   . S/P cardiac cath 11/18/2013   Past Surgical History:  Procedure Laterality Date  . CARDIAC DEFIBRILLATOR PLACEMENT    . CARDIAC DEFIBRILLATOR PLACEMENT    . PACEMAKER IMPLANT       A IV Location/Drains/Wounds Patient Lines/Drains/Airways Status   Active Line/Drains/Airways    Name:   Placement date:   Placement time:   Site:   Days:   Peripheral IV 05/06/19 Right Antecubital   05/06/19    2329    Antecubital   1          Intake/Output Last 24 hours  Intake/Output Summary (Last 24 hours) at  05/07/2019 1924 Last data filed at 05/07/2019 1421 Gross per 24 hour  Intake --  Output 1100 ml  Net -1100 ml    Labs/Imaging Results for orders placed or performed during the Raymond encounter of 05/06/19 (from the past 48 hour(s))  Comprehensive metabolic panel     Status: Abnormal   Collection Time: 05/06/19 11:31 PM  Result Value Ref Range   Sodium 137 135 - 145 mmol/L   Potassium 4.7 3.5 - 5.1 mmol/L   Chloride 102 98 - 111 mmol/L   CO2 26 22 - 32 mmol/L   Glucose, Bld 173 (H) 70 - 99 mg/dL   BUN 24 (H) 8 - 23 mg/dL   Creatinine, Ser 1.11 0.61 - 1.24 mg/dL   Calcium 8.8 (L) 8.9 - 10.3 mg/dL   Total Protein 7.6 6.5 - 8.1 g/dL   Albumin 3.9 3.5 - 5.0 g/dL   AST 21 15 - 41 U/L   ALT 19 0 - 44 U/L   Alkaline Phosphatase 69 38 - 126 U/L   Total Bilirubin 0.7 0.3 - 1.2 mg/dL   GFR calc non Af Amer >60 >60 mL/min   GFR calc Af Amer >60 >60 mL/min   Anion gap 9 5 - 15    Comment: Performed at [facility], [address], [place], [place] [address]  CBC WITH DIFFERENTIAL     Status: Abnormal   Collection Time: 05/06/19 11:31 PM  Result Value Ref Range  WBC 9.6 4.0 - 10.5 K/uL   RBC 5.07 4.22 - 5.81 MIL/uL   Hemoglobin 16.4 13.0 - 17.0 g/dL   HCT 50.1 39.0 - 52.0 %   MCV 98.8 80.0 - 100.0 fL   MCH 32.3 26.0 - 34.0 pg   MCHC 32.7 30.0 - 36.0 g/dL   RDW 12.9 11.5 - 15.5 %   Platelets 205 150 - 400 K/uL   nRBC 0.0 0.0 - 0.2 %   Neutrophils Relative % 39 %   Neutro Abs 3.7 1.7 - 7.7 K/uL   Lymphocytes Relative 48 %   Lymphs Abs 4.7 (H) 0.7 - 4.0 K/uL   Monocytes Relative 10 %   Monocytes Absolute 1.0 0.1 - 1.0 K/uL   Eosinophils Relative 2 %   Eosinophils Absolute 0.2 0.0 - 0.5 K/uL   Basophils Relative 1 %   Basophils Absolute 0.1 0.0 - 0.1 K/uL   Immature Granulocytes 0 %   Abs Immature Granulocytes 0.02 0.00 - 0.07 K/uL    Comment: Performed at [facility], [address], [place], [place] [address]  Brain natriuretic peptide     Status: Abnormal   Collection  Time: 05/06/19 11:31 PM  Result Value Ref Range   B Natriuretic Peptide 706.0 (H) 0.0 - 100.0 pg/mL    Comment: Performed at [facility], [address], [place], [place] [address]  Protime-INR     Status: Abnormal   Collection Time: 05/06/19 11:31 PM  Result Value Ref Range   Prothrombin Time 19.2 (H) 11.4 - 15.2 seconds   INR 1.6 (H) 0.8 - 1.2    Comment: (NOTE) INR goal varies based on device and disease states. Performed at [facility], [address], [place], [place] [address]   Respiratory Panel by RT PCR (Flu A&B, Covid) - Nasopharyngeal Swab     Status: None   Collection Time: 05/06/19 11:35 PM   Specimen: Nasopharyngeal Swab  Result Value Ref Range   SARS Coronavirus 2 by RT PCR NEGATIVE NEGATIVE    Comment: (NOTE) SARS-CoV-2 target nucleic acids are NOT DETECTED. The SARS-CoV-2 RNA is generally detectable in upper respiratoy specimens during the acute phase of infection. The lowest concentration of SARS-CoV-2 viral copies this assay can detect is 131 copies/mL. A negative result does not preclude SARS-Cov-2 infection and should not be used as the sole basis for treatment or other patient management decisions. A negative result may occur with  improper specimen collection/handling, submission of specimen other than nasopharyngeal swab, presence of viral mutation(s) within the areas targeted by this assay, and inadequate number of viral copies (<131 copies/mL). A negative result must be combined with clinical observations, patient history, and epidemiological information. The expected result is Negative. Fact Sheet for Patients:  PinkCheek.be Fact Sheet for Healthcare Providers:  GravelBags.it This test is not yet ap proved or cleared by the [place] FDA and  has been authorized for detection and/or diagnosis of SARS-CoV-2 by FDA under an Emergency Use Authorization (EUA). This EUA will remain  in effect  (meaning this test can be used) for the duration of the COVID-19 declaration under Section 564(b)(1) of the Act, 21 U.S.C. section 360bbb-3(b)(1), unless the authorization is terminated or revoked sooner.    Influenza A by PCR NEGATIVE NEGATIVE   Influenza B by PCR NEGATIVE NEGATIVE    Comment: (NOTE) The Xpert Xpress SARS-CoV-2/FLU/RSV assay is intended as an aid in  the diagnosis of influenza from Nasopharyngeal swab specimens and  should not be used as a  sole basis for treatment. Nasal washings and  aspirates are unacceptable for Xpert Xpress SARS-CoV-2/FLU/RSV  testing. Fact Sheet for Patients: PinkCheek.be Fact Sheet for Healthcare Providers: GravelBags.it This test is not yet approved or cleared by the [place] FDA and  has been authorized for detection and/or diagnosis of SARS-CoV-2 by  FDA under an Emergency Use Authorization (EUA). This EUA will remain  in effect (meaning this test can be used) for the duration of the  Covid-19 declaration under Section 564(b)(1) of the Act, 21  U.S.C. section 360bbb-3(b)(1), unless the authorization is  terminated or revoked. Performed at [facility], [address], [place], [place] [address]   Urinalysis, Routine w reflex microscopic     Status: Abnormal   Collection Time: 05/07/19  1:00 AM  Result Value Ref Range   Color, Urine STRAW (A) YELLOW   APPearance CLEAR CLEAR   Specific Gravity, Urine 1.008 1.005 - 1.030   pH 5.0 5.0 - 8.0   Glucose, UA NEGATIVE NEGATIVE mg/dL   Hgb urine dipstick NEGATIVE NEGATIVE   Bilirubin Urine NEGATIVE NEGATIVE   Ketones, ur NEGATIVE NEGATIVE mg/dL   Protein, ur NEGATIVE NEGATIVE mg/dL   Nitrite NEGATIVE NEGATIVE   Leukocytes,Ua NEGATIVE NEGATIVE    Comment: Performed at [facility], [address] [place], [place] [address]   DG Chest Port 1 View  Result Date: 05/06/2019 CLINICAL DATA:  Shortness of breath EXAM: PORTABLE CHEST 1  VIEW COMPARISON:  None. FINDINGS: Left chest wall AICD leads in expected position. The lungs are hyperinflated with diffuse interstitial prominence. No focal airspace consolidation or pulmonary edema. Left basilar atelectasis. No pleural effusion or pneumothorax. Normal cardiomediastinal contours. IMPRESSION: Left lower lobe atelectasis. Mild diffuse interstitial coarsening may indicate COPD. Electronically Signed   By: Ulyses Jarred M.D.   On: 05/06/2019 23:56   ECHOCARDIOGRAM COMPLETE  Result Date: 05/07/2019   ECHOCARDIOGRAM REPORT   Patient Name:   Walter Raymond Date of Exam: 05/07/2019 Medical Rec #:  [id number]       Height:       68.0 in Accession #:    [id number]      Weight:       172.0 lb Date of Birth:  06-04-1945       BSA:          1.92 m Patient Age:    35 years        BP:           124/76 mmHg Patient Gender: M               HR:           77 bpm. Exam Location:  [facility] Procedure: 2D Echo, Cardiac Doppler and Color Doppler Indications:    CHF-Acute Diastolic [id number] / [id number]  History:        Patient has no prior history of Echocardiogram examinations.                 CAD, Pacemaker and Defibrillator, Arrythmias:Atrial                 Fibrillation; Risk Factors:Hypertension and Dyslipidemia. Acute                 respiratory failure with hypoxia, History of cardioversion.  Sonographer:    Alvino Chapel RCS Referring Phys: [id number] OLADAPO ADEFESO IMPRESSIONS  1. Left ventricular ejection fraction, by visual estimation, is 35-40%. The left ventricle has mild to moderately decreased function. There is mild left  ventricular hypertrophy.  2. Severe hypokinesis of the left ventricular, basal anteroseptal wall, consistent with previous septal ablation.  3. Severe hypokinesis of the left ventricular inferior wall.  4. Abnormal septal motion consistent with RV pacemaker.  5. The left ventricle demonstrates regional wall motion abnormalities.  6. Left ventricular diastolic parameters are consistent with  Grade I diastolic dysfunction (impaired relaxation).  7. Global right ventricle has normal systolic function.The right ventricular size is normal. No increase in right ventricular wall thickness.  8. A pacer wire is visualized.  9. Left atrial size was mildly dilated. 10. Right atrial size was normal. 11. The mitral valve is normal in structure. No evidence of mitral valve regurgitation. No evidence of mitral stenosis. 12. The tricuspid valve is normal in structure. 13. The aortic valve is normal in structure. Aortic valve regurgitation is not visualized. No evidence of aortic valve sclerosis or stenosis. 14. The pulmonic valve was normal in structure. Pulmonic valve regurgitation is not visualized. 15. The inferior vena cava is normal in size with greater than 50% respiratory variability, suggesting right atrial pressure of 3 mmHg. FINDINGS  Left Ventricle: Left ventricular ejection fraction, by visual estimation, is 35 to 40%. The left ventricle has moderately decreased function. Severe hypokinesis of the left ventricular, entire inferior wall. Severe hypokinesis of the left ventricular, basal anteroseptal wall. The left ventricle demonstrates regional wall motion abnormalities. There is mildly increased left ventricular hypertrophy. Concentric left ventricular hypertrophy. Abnormal (paradoxical) septal motion, consistent with RV pacemaker. Left ventricular diastolic parameters are consistent with Grade I diastolic dysfunction (impaired relaxation). Normal left atrial pressure. Right Ventricle: The right ventricular size is normal. No increase in right ventricular wall thickness. Global RV systolic function is has normal systolic function. Left Atrium: Left atrial size was mildly dilated. Right Atrium: Right atrial size was normal in size Pericardium: There is no evidence of pericardial effusion. Mitral Valve: The mitral valve is normal in structure. No evidence of mitral valve regurgitation. No evidence of mitral  valve stenosis by observation. Tricuspid Valve: The tricuspid valve is normal in structure. Tricuspid valve regurgitation is not demonstrated. Aortic Valve: The aortic valve is normal in structure. Aortic valve regurgitation is not visualized. The aortic valve is structurally normal, with no evidence of sclerosis or stenosis. Pulmonic Valve: The pulmonic valve was normal in structure. Pulmonic valve regurgitation is not visualized. Pulmonic regurgitation is not visualized. Aorta: The aortic root, ascending aorta and aortic arch are all structurally normal, with no evidence of dilitation or obstruction. Venous: The inferior vena cava is normal in size with greater than 50% respiratory variability, suggesting right atrial pressure of 3 mmHg. IAS/Shunts: No atrial level shunt detected by color flow Doppler. There is no evidence of a patent foramen ovale. No ventricular septal defect is seen or detected. There is no evidence of an atrial septal defect. Additional Comments: A pacer wire is visualized.  LEFT VENTRICLE PLAX 2D LVIDd:         4.62 cm       Diastology LVIDs:         3.37 cm       LV e' lateral:   6.85 cm/s LV PW:         1.19 cm       LV E/e' lateral: 7.7 LV IVS:        1.16 cm LVOT diam:     2.20 cm LV SV:         52 ml LV SV Index:  26.65 LVOT Area:     3.80 cm  LV Volumes (MOD) LV area d, A2C:    26.50 cm LV area d, A4C:    27.40 cm LV area s, A2C:    18.60 cm LV area s, A4C:    16.00 cm LV major d, A2C:   7.48 cm LV major d, A4C:   7.33 cm LV major s, A2C:   6.99 cm LV major s, A4C:   6.68 cm LV vol d, MOD A2C: 78.3 ml LV vol d, MOD A4C: 83.8 ml LV vol s, MOD A2C: 43.6 ml LV vol s, MOD A4C: 32.5 ml LV SV MOD A2C:     34.8 ml LV SV MOD A4C:     83.8 ml LV SV MOD BP:      43.2 ml RIGHT VENTRICLE TAPSE (M-mode): 2.1 cm LEFT ATRIUM            Index       RIGHT ATRIUM           Index LA diam:      4.30 cm  2.24 cm/m  RA Area:     15.50 cm LA Vol (A4C): 106.3 ml 55.44 ml/m RA Volume:   38.60 ml  20.14  ml/m  AORTIC VALVE LVOT Vmax:   106.00 cm/s LVOT Vmean:  76.600 cm/s LVOT VTI:    0.250 m  AORTA Ao Root diam: 3.50 cm MITRAL VALVE MV Area (PHT): 8.25 cm    SHUNTS MV PHT:        26.68 msec  Systemic VTI:  0.25 m MV Decel Time: 92 msec     Systemic Diam: 2.20 cm MV E velocity: 53.00 cm/s  Mihai Croitoru MD Electronically signed by Sanda Klein MD Signature Date/Time: 05/07/2019/12:26:04 PM    Final     Pending Labs Unresulted Labs (From admission, onward)    Start     Ordered   05/09/19 0500  CBC  Every Mon-Wed-Fri (0500),   R     05/07/19 1228   05/08/19 [id number]  Basic metabolic panel  Daily,   R     05/07/19 0348          Vitals/Pain Today's Vitals   05/07/19 1700 05/07/19 1730 05/07/19 1800 05/07/19 1830  BP: 102/63 102/63 105/61 108/70  Pulse: 79 94 78 80  Resp: 19 (!) 26 19 (!) 24  Temp:      SpO2: 99% 99% 99% 99%  Weight:      Height:        Isolation Precautions No active isolations  Medications Medications  sodium chloride flush (NS) 0.9 % injection 3 mL (3 mLs Intravenous Not Given 05/07/19 1048)  sodium chloride flush (NS) 0.9 % injection 3 mL (has no administration in time range)  0.9 %  sodium chloride infusion (has no administration in time range)  acetaminophen (TYLENOL) tablet 650 mg (has no administration in time range)  ondansetron (ZOFRAN) injection 4 mg (has no administration in time range)  lisinopril (ZESTRIL) tablet 10 mg (10 mg Oral Given 05/07/19 1016)  furosemide (LASIX) tablet 40 mg (40 mg Oral Given 05/07/19 1048)  citalopram (CELEXA) tablet 20 mg (20 mg Oral Given 05/07/19 1449)  metoprolol succinate (TOPROL-XL) 24 hr tablet 25 mg (25 mg Oral Given 05/07/19 1348)  sotalol (BETAPACE) tablet 120 mg (120 mg Oral Given 05/07/19 1449)  rivaroxaban (XARELTO) tablet 20 mg (20 mg Oral Given 05/07/19 1614)  albuterol (VENTOLIN HFA) 108 (90 Base) MCG/ACT inhaler 2 puff (  has no administration in time range)  dexamethasone (DECADRON) injection 10 mg (10  mg Intravenous Given 05/06/19 2339)  nitroGLYCERIN (NITROGLYN) 2 % ointment 0.5 inch (0.5 inches Topical Given 05/07/19 0019)  furosemide (LASIX) injection 20 mg (20 mg Intravenous Given 05/07/19 0018)    Mobility walks     Focused Assessments    R Recommendations: See Admitting Provider Note  Report given to:   Additional Notes:

## 2019-05-08 LAB — BASIC METABOLIC PANEL
Anion gap: 6 (ref 5–15)
BUN: 20 mg/dL (ref 8–23)
CO2: 24 mmol/L (ref 22–32)
Calcium: 8.5 mg/dL — ABNORMAL LOW (ref 8.9–10.3)
Chloride: 106 mmol/L (ref 98–111)
Creatinine, Ser: 0.85 mg/dL (ref 0.61–1.24)
GFR calc Af Amer: >60 mL/min (ref >60)
GFR calc non Af Amer: >60 mL/min (ref >60)
Glucose, Bld: 110 mg/dL — ABNORMAL HIGH (ref 70–99)
Potassium: 3.7 mmol/L (ref 3.5–5.1)
Sodium: 136 mmol/L (ref 135–145)

## 2019-05-08 NOTE — Discharge Summary (Signed)
Walter Raymond, is a 73 y.o. male  DOB 1945/08/30  MRN [id number].  Admission date:  05/06/2019  Admitting Physician  Bernadette Hoit, DO  Discharge Date:  05/08/2019   Primary MD  Manon Hilding, MD  Recommendations for primary care physician for things to follow:      Admission Diagnosis  CHF (congestive heart failure) ([place]) [I50.9]   Discharge Diagnosis  CHF (congestive heart failure) ([place]) [I50.9]    Principal Problem:   Acute respiratory failure with hypoxia ([place]) Active Problems:   Acute CHF (congestive heart failure) ([facility])   Atrial fibrillation, chronic ([facility])   CAD (coronary artery disease)   Essential hypertension   Hyperlipidemia   History of medication noncompliance      Past Medical History:  Diagnosis Date  . A-fib ([place])   . Coronary artery disease   . History of cardioversion   . Hypercholesteremia   . Pacemaker   . PTSD (post-traumatic stress disorder)   . S/P cardiac cath 11/18/2013    Past Surgical History:  Procedure Laterality Date  . CARDIAC DEFIBRILLATOR PLACEMENT    . CARDIAC DEFIBRILLATOR PLACEMENT    . PACEMAKER IMPLANT         HPI  from the history and physical done on the day of admission:     This patient was admitted with dyspnea and desaturation.  Please see initial history and physical examination for initial evaluation.  The patient had not taken Lasix for 2 days prior to admission.    Hospital Course:        He was admitted to the hospital and actually remained in the emergency room due to lack of beds on the floor.  He was given intravenous Lasix and he improved with his symptoms very quickly.  Electrolytes remained acceptable with normal potassium.  This morning, he feels well, is not dyspneic at rest and is saturating appropriately. An echocardiogram was done which shows reduced ejection fraction of 35 to 40%.  He has a cardiologist that  he follows with.   Discharge Condition: Stable.  Follow UP     Consults obtained -none.  Diet and Activity recommendation:  As advised  Discharge Instructions    He has been recommended to make sure he does take his Lasix every day.  He not been taking Lasix for 2 days prior to admission. Discharge Instructions    Diet - low sodium heart healthy   Complete by: As directed    Increase activity slowly   Complete by: As directed         Discharge Medications     Continue with home medications.  Major procedures and Radiology Reports - PLEASE review detailed and final reports for all details, in brief -      DG Chest Port 1 View  Result Date: 05/06/2019 CLINICAL DATA:  Shortness of breath EXAM: PORTABLE CHEST 1 VIEW COMPARISON:  None. FINDINGS: Left chest wall AICD leads in expected position. The lungs are hyperinflated with diffuse interstitial prominence. No focal airspace  consolidation or pulmonary edema. Left basilar atelectasis. No pleural effusion or pneumothorax. Normal cardiomediastinal contours. IMPRESSION: Left lower lobe atelectasis. Mild diffuse interstitial coarsening may indicate COPD. Electronically Signed   By: Ulyses Jarred M.D.   On: 05/06/2019 23:56   ECHOCARDIOGRAM COMPLETE  Result Date: 05/07/2019   ECHOCARDIOGRAM REPORT   Patient Name:   Walter Raymond Date of Exam: 05/07/2019 Medical Rec #:  [id number]       Height:       68.0 in Accession #:    [id number]      Weight:       172.0 lb Date of Birth:  Sep 24, 1945       BSA:          1.92 m Patient Age:    82 years        BP:           124/76 mmHg Patient Gender: M               HR:           77 bpm. Exam Location:  [facility] Procedure: 2D Echo, Cardiac Doppler and Color Doppler Indications:    CHF-Acute Diastolic [id number] / [id number]  History:        Patient has no prior history of Echocardiogram examinations.                 CAD, Pacemaker and Defibrillator, Arrythmias:Atrial                 Fibrillation; Risk  Factors:Hypertension and Dyslipidemia. Acute                 respiratory failure with hypoxia, History of cardioversion.  Sonographer:    Alvino Chapel RCS Referring Phys: [id number] OLADAPO ADEFESO IMPRESSIONS  1. Left ventricular ejection fraction, by visual estimation, is 35-40%. The left ventricle has mild to moderately decreased function. There is mild left ventricular hypertrophy.  2. Severe hypokinesis of the left ventricular, basal anteroseptal wall, consistent with previous septal ablation.  3. Severe hypokinesis of the left ventricular inferior wall.  4. Abnormal septal motion consistent with RV pacemaker.  5. The left ventricle demonstrates regional wall motion abnormalities.  6. Left ventricular diastolic parameters are consistent with Grade I diastolic dysfunction (impaired relaxation).  7. Global right ventricle has normal systolic function.The right ventricular size is normal. No increase in right ventricular wall thickness.  8. A pacer wire is visualized.  9. Left atrial size was mildly dilated. 10. Right atrial size was normal. 11. The mitral valve is normal in structure. No evidence of mitral valve regurgitation. No evidence of mitral stenosis. 12. The tricuspid valve is normal in structure. 13. The aortic valve is normal in structure. Aortic valve regurgitation is not visualized. No evidence of aortic valve sclerosis or stenosis. 14. The pulmonic valve was normal in structure. Pulmonic valve regurgitation is not visualized. 15. The inferior vena cava is normal in size with greater than 50% respiratory variability, suggesting right atrial pressure of 3 mmHg. FINDINGS  Left Ventricle: Left ventricular ejection fraction, by visual estimation, is 35 to 40%. The left ventricle has moderately decreased function. Severe hypokinesis of the left ventricular, entire inferior wall. Severe hypokinesis of the left ventricular, basal anteroseptal wall. The left ventricle demonstrates regional wall motion  abnormalities. There is mildly increased left ventricular hypertrophy. Concentric left ventricular hypertrophy. Abnormal (paradoxical) septal motion, consistent with RV pacemaker. Left ventricular diastolic parameters are consistent with Grade I diastolic dysfunction (impaired relaxation).  Normal left atrial pressure. Right Ventricle: The right ventricular size is normal. No increase in right ventricular wall thickness. Global RV systolic function is has normal systolic function. Left Atrium: Left atrial size was mildly dilated. Right Atrium: Right atrial size was normal in size Pericardium: There is no evidence of pericardial effusion. Mitral Valve: The mitral valve is normal in structure. No evidence of mitral valve regurgitation. No evidence of mitral valve stenosis by observation. Tricuspid Valve: The tricuspid valve is normal in structure. Tricuspid valve regurgitation is not demonstrated. Aortic Valve: The aortic valve is normal in structure. Aortic valve regurgitation is not visualized. The aortic valve is structurally normal, with no evidence of sclerosis or stenosis. Pulmonic Valve: The pulmonic valve was normal in structure. Pulmonic valve regurgitation is not visualized. Pulmonic regurgitation is not visualized. Aorta: The aortic root, ascending aorta and aortic arch are all structurally normal, with no evidence of dilitation or obstruction. Venous: The inferior vena cava is normal in size with greater than 50% respiratory variability, suggesting right atrial pressure of 3 mmHg. IAS/Shunts: No atrial level shunt detected by color flow Doppler. There is no evidence of a patent foramen ovale. No ventricular septal defect is seen or detected. There is no evidence of an atrial septal defect. Additional Comments: A pacer wire is visualized.  LEFT VENTRICLE PLAX 2D LVIDd:         4.62 cm       Diastology LVIDs:         3.37 cm       LV e' lateral:   6.85 cm/s LV PW:         1.19 cm       LV E/e' lateral: 7.7 LV  IVS:        1.16 cm LVOT diam:     2.20 cm LV SV:         52 ml LV SV Index:   26.65 LVOT Area:     3.80 cm  LV Volumes (MOD) LV area d, A2C:    26.50 cm LV area d, A4C:    27.40 cm LV area s, A2C:    18.60 cm LV area s, A4C:    16.00 cm LV major d, A2C:   7.48 cm LV major d, A4C:   7.33 cm LV major s, A2C:   6.99 cm LV major s, A4C:   6.68 cm LV vol d, MOD A2C: 78.3 ml LV vol d, MOD A4C: 83.8 ml LV vol s, MOD A2C: 43.6 ml LV vol s, MOD A4C: 32.5 ml LV SV MOD A2C:     34.8 ml LV SV MOD A4C:     83.8 ml LV SV MOD BP:      43.2 ml RIGHT VENTRICLE TAPSE (M-mode): 2.1 cm LEFT ATRIUM            Index       RIGHT ATRIUM           Index LA diam:      4.30 cm  2.24 cm/m  RA Area:     15.50 cm LA Vol (A4C): 106.3 ml 55.44 ml/m RA Volume:   38.60 ml  20.14 ml/m  AORTIC VALVE LVOT Vmax:   106.00 cm/s LVOT Vmean:  76.600 cm/s LVOT VTI:    0.250 m  AORTA Ao Root diam: 3.50 cm MITRAL VALVE MV Area (PHT): 8.25 cm    SHUNTS MV PHT:        26.68 msec  Systemic VTI:  0.25 m MV Decel Time: 92 msec     Systemic Diam: 2.20 cm MV E velocity: 53.00 cm/s  Dani Gobble Croitoru MD Electronically signed by Sanda Klein MD Signature Date/Time: 05/07/2019/12:26:04 PM    Final       Recent Results (from the past 240 hour(s))  Respiratory Panel by RT PCR (Flu A&B, Covid) - Nasopharyngeal Swab     Status: None   Collection Time: 05/06/19 11:35 PM   Specimen: Nasopharyngeal Swab  Result Value Ref Range Status   SARS Coronavirus 2 by RT PCR NEGATIVE NEGATIVE Final    Comment: (NOTE) SARS-CoV-2 target nucleic acids are NOT DETECTED. The SARS-CoV-2 RNA is generally detectable in upper respiratoy specimens during the acute phase of infection. The lowest concentration of SARS-CoV-2 viral copies this assay can detect is 131 copies/mL. A negative result does not preclude SARS-Cov-2 infection and should not be used as the sole basis for treatment or other patient management decisions. A negative result may occur with  improper  specimen collection/handling, submission of specimen other than nasopharyngeal swab, presence of viral mutation(s) within the areas targeted by this assay, and inadequate number of viral copies (<131 copies/mL). A negative result must be combined with clinical observations, patient history, and epidemiological information. The expected result is Negative. Fact Sheet for Patients:  PinkCheek.be Fact Sheet for Healthcare Providers:  GravelBags.it This test is not yet ap proved or cleared by the [place] FDA and  has been authorized for detection and/or diagnosis of SARS-CoV-2 by FDA under an Emergency Use Authorization (EUA). This EUA will remain  in effect (meaning this test can be used) for the duration of the COVID-19 declaration under Section 564(b)(1) of the Act, 21 U.S.C. section 360bbb-3(b)(1), unless the authorization is terminated or revoked sooner.    Influenza A by PCR NEGATIVE NEGATIVE Final   Influenza B by PCR NEGATIVE NEGATIVE Final    Comment: (NOTE) The Xpert Xpress SARS-CoV-2/FLU/RSV assay is intended as an aid in  the diagnosis of influenza from Nasopharyngeal swab specimens and  should not be used as a sole basis for treatment. Nasal washings and  aspirates are unacceptable for Xpert Xpress SARS-CoV-2/FLU/RSV  testing. Fact Sheet for Patients: PinkCheek.be Fact Sheet for Healthcare Providers: GravelBags.it This test is not yet approved or cleared by the [place] FDA and  has been authorized for detection and/or diagnosis of SARS-CoV-2 by  FDA under an Emergency Use Authorization (EUA). This EUA will remain  in effect (meaning this test can be used) for the duration of the  Covid-19 declaration under Section 564(b)(1) of the Act, 21  U.S.C. section 360bbb-3(b)(1), unless the authorization is  terminated or revoked. Performed at [facility], [address] [place], [place] [address]        Today   Subjective    Walter Raymond today has no dyspnea at rest.          Patient has been seen and examined prior to discharge   Objective   Blood pressure 130/84, pulse 86, temperature 98 F (36.7 C), resp. rate (!) 22, height 5\' 8"  (1.727 m), weight 78 kg, SpO2 100 %. Looks systemically well and is not in any respiratory distress at rest.  Lung fields are entirely clear with no crackles, wheezing or bronchial breathing.  There is no peripheral pitting edema.  There is no gallop rhythm.  He is clinically no longer in heart failure.  Intake/Output Summary (Last 24 hours) at 05/08/2019 0946 Last data filed at 05/07/2019  2219 Gross per 24 hour  Intake --  Output 1350 ml  Net -1350 ml       Data Review   CBC w Diff:  Lab Results  Component Value Date   WBC 9.6 05/06/2019   HGB 16.4 05/06/2019   HCT 50.1 05/06/2019   PLT 205 05/06/2019   LYMPHOPCT 48 05/06/2019   MONOPCT 10 05/06/2019   EOSPCT 2 05/06/2019   BASOPCT 1 05/06/2019    CMP:  Lab Results  Component Value Date   NA 136 05/08/2019   K 3.7 05/08/2019   CL 106 05/08/2019   CO2 24 05/08/2019   BUN 20 05/08/2019   CREATININE 0.85 05/08/2019   PROT 7.6 05/06/2019   ALBUMIN 3.9 05/06/2019   BILITOT 0.7 05/06/2019   ALKPHOS 69 05/06/2019   AST 21 05/06/2019   ALT 19 05/06/2019  .   Total Discharge time is about 33 minutes  Doree Albee M.D on 05/08/2019 at 9:46 AM  Go to [email or web address] -  for contact info  [facility] - Office  [phone]

## 2019-05-12 DIAGNOSIS — I10 Essential (primary) hypertension: Secondary | ICD-10-CM | POA: Diagnosis not present

## 2019-05-12 DIAGNOSIS — I472 Ventricular tachycardia: Secondary | ICD-10-CM | POA: Diagnosis not present

## 2019-05-12 DIAGNOSIS — I483 Typical atrial flutter: Secondary | ICD-10-CM | POA: Diagnosis not present

## 2019-05-12 DIAGNOSIS — I421 Obstructive hypertrophic cardiomyopathy: Secondary | ICD-10-CM | POA: Diagnosis not present

## 2019-05-12 DIAGNOSIS — I420 Dilated cardiomyopathy: Secondary | ICD-10-CM | POA: Diagnosis not present

## 2019-05-12 DIAGNOSIS — I4819 Other persistent atrial fibrillation: Secondary | ICD-10-CM | POA: Diagnosis not present

## 2019-05-25 DIAGNOSIS — I4819 Other persistent atrial fibrillation: Secondary | ICD-10-CM | POA: Diagnosis not present

## 2019-06-02 DIAGNOSIS — Z20822 Contact with and (suspected) exposure to covid-19: Secondary | ICD-10-CM | POA: Diagnosis not present

## 2019-06-02 DIAGNOSIS — Z9889 Other specified postprocedural states: Secondary | ICD-10-CM | POA: Diagnosis not present

## 2019-06-02 DIAGNOSIS — Z01812 Encounter for preprocedural laboratory examination: Secondary | ICD-10-CM | POA: Diagnosis not present

## 2019-06-02 DIAGNOSIS — Z01811 Encounter for preprocedural respiratory examination: Secondary | ICD-10-CM | POA: Diagnosis not present

## 2019-06-02 DIAGNOSIS — Z7901 Long term (current) use of anticoagulants: Secondary | ICD-10-CM | POA: Diagnosis not present

## 2019-06-02 DIAGNOSIS — Z01818 Encounter for other preprocedural examination: Secondary | ICD-10-CM | POA: Diagnosis not present

## 2019-06-02 DIAGNOSIS — I4819 Other persistent atrial fibrillation: Secondary | ICD-10-CM | POA: Diagnosis not present

## 2019-06-02 DIAGNOSIS — Z01810 Encounter for preprocedural cardiovascular examination: Secondary | ICD-10-CM | POA: Diagnosis not present

## 2019-06-06 DIAGNOSIS — I509 Heart failure, unspecified: Secondary | ICD-10-CM | POA: Diagnosis present

## 2019-06-06 DIAGNOSIS — I421 Obstructive hypertrophic cardiomyopathy: Secondary | ICD-10-CM | POA: Diagnosis present

## 2019-06-06 DIAGNOSIS — N183 Chronic kidney disease, stage 3 unspecified: Secondary | ICD-10-CM | POA: Diagnosis not present

## 2019-06-06 DIAGNOSIS — I110 Hypertensive heart disease with heart failure: Secondary | ICD-10-CM | POA: Diagnosis present

## 2019-06-06 DIAGNOSIS — Z45018 Encounter for adjustment and management of other part of cardiac pacemaker: Secondary | ICD-10-CM | POA: Diagnosis not present

## 2019-06-06 DIAGNOSIS — I420 Dilated cardiomyopathy: Secondary | ICD-10-CM | POA: Diagnosis not present

## 2019-06-06 DIAGNOSIS — R918 Other nonspecific abnormal finding of lung field: Secondary | ICD-10-CM | POA: Diagnosis not present

## 2019-06-06 DIAGNOSIS — I4819 Other persistent atrial fibrillation: Secondary | ICD-10-CM | POA: Diagnosis present

## 2019-06-06 DIAGNOSIS — K219 Gastro-esophageal reflux disease without esophagitis: Secondary | ICD-10-CM | POA: Diagnosis not present

## 2019-06-06 DIAGNOSIS — I517 Cardiomegaly: Secondary | ICD-10-CM | POA: Diagnosis not present

## 2019-06-06 DIAGNOSIS — Z95810 Presence of automatic (implantable) cardiac defibrillator: Secondary | ICD-10-CM | POA: Diagnosis not present

## 2019-06-06 DIAGNOSIS — Z79899 Other long term (current) drug therapy: Secondary | ICD-10-CM | POA: Diagnosis not present

## 2019-06-06 DIAGNOSIS — J939 Pneumothorax, unspecified: Secondary | ICD-10-CM | POA: Diagnosis not present

## 2019-06-06 DIAGNOSIS — I482 Chronic atrial fibrillation, unspecified: Secondary | ICD-10-CM | POA: Diagnosis not present

## 2019-06-06 DIAGNOSIS — I252 Old myocardial infarction: Secondary | ICD-10-CM | POA: Diagnosis not present

## 2019-06-06 DIAGNOSIS — I340 Nonrheumatic mitral (valve) insufficiency: Secondary | ICD-10-CM | POA: Diagnosis present

## 2019-06-06 DIAGNOSIS — Z7901 Long term (current) use of anticoagulants: Secondary | ICD-10-CM | POA: Diagnosis not present

## 2019-06-06 DIAGNOSIS — I25119 Atherosclerotic heart disease of native coronary artery with unspecified angina pectoris: Secondary | ICD-10-CM | POA: Diagnosis not present

## 2019-06-06 DIAGNOSIS — J9811 Atelectasis: Secondary | ICD-10-CM | POA: Diagnosis not present

## 2019-06-06 DIAGNOSIS — I130 Hypertensive heart and chronic kidney disease with heart failure and stage 1 through stage 4 chronic kidney disease, or unspecified chronic kidney disease: Secondary | ICD-10-CM | POA: Diagnosis not present

## 2019-06-06 DIAGNOSIS — Z880 Allergy status to penicillin: Secondary | ICD-10-CM | POA: Diagnosis not present

## 2019-06-22 DIAGNOSIS — J9811 Atelectasis: Secondary | ICD-10-CM | POA: Diagnosis not present

## 2019-06-22 DIAGNOSIS — Z9889 Other specified postprocedural states: Secondary | ICD-10-CM | POA: Diagnosis not present

## 2019-06-22 DIAGNOSIS — J90 Pleural effusion, not elsewhere classified: Secondary | ICD-10-CM | POA: Diagnosis not present

## 2019-08-09 DIAGNOSIS — I4819 Other persistent atrial fibrillation: Secondary | ICD-10-CM | POA: Diagnosis not present

## 2019-08-09 DIAGNOSIS — I421 Obstructive hypertrophic cardiomyopathy: Secondary | ICD-10-CM | POA: Diagnosis not present

## 2019-08-09 DIAGNOSIS — I420 Dilated cardiomyopathy: Secondary | ICD-10-CM | POA: Diagnosis not present

## 2019-08-09 DIAGNOSIS — I429 Cardiomyopathy, unspecified: Secondary | ICD-10-CM | POA: Diagnosis not present

## 2019-08-09 DIAGNOSIS — Z95810 Presence of automatic (implantable) cardiac defibrillator: Secondary | ICD-10-CM | POA: Diagnosis not present

## 2019-08-09 DIAGNOSIS — I340 Nonrheumatic mitral (valve) insufficiency: Secondary | ICD-10-CM | POA: Diagnosis not present

## 2020-01-04 ENCOUNTER — Emergency Department (HOSPITAL_COMMUNITY): Payer: Medicare Other

## 2020-01-04 ENCOUNTER — Encounter (HOSPITAL_COMMUNITY): Payer: Self-pay | Admitting: *Deleted

## 2020-01-04 ENCOUNTER — Emergency Department (HOSPITAL_COMMUNITY)
Admission: EM | Admit: 2020-01-04 | Discharge: 2020-01-04 | Disposition: A | Discharge status: Home / Self Care | Payer: Medicare Other | Attending: Emergency Medicine | Admitting: Emergency Medicine

## 2020-01-04 ENCOUNTER — Other Ambulatory Visit: Payer: Self-pay

## 2020-01-04 DIAGNOSIS — I509 Heart failure, unspecified: Secondary | ICD-10-CM | POA: Insufficient documentation

## 2020-01-04 DIAGNOSIS — I2510 Atherosclerotic heart disease of native coronary artery without angina pectoris: Secondary | ICD-10-CM | POA: Insufficient documentation

## 2020-01-04 DIAGNOSIS — Z950 Presence of cardiac pacemaker: Secondary | ICD-10-CM | POA: Insufficient documentation

## 2020-01-04 DIAGNOSIS — I4891 Unspecified atrial fibrillation: Secondary | ICD-10-CM | POA: Diagnosis not present

## 2020-01-04 DIAGNOSIS — I110 Hypertensive heart disease with heart failure: Secondary | ICD-10-CM | POA: Diagnosis not present

## 2020-01-04 DIAGNOSIS — U071 COVID-19: Secondary | ICD-10-CM | POA: Diagnosis not present

## 2020-01-04 DIAGNOSIS — Z7901 Long term (current) use of anticoagulants: Secondary | ICD-10-CM | POA: Insufficient documentation

## 2020-01-04 DIAGNOSIS — Z79899 Other long term (current) drug therapy: Secondary | ICD-10-CM | POA: Insufficient documentation

## 2020-01-04 DIAGNOSIS — R0789 Other chest pain: Secondary | ICD-10-CM

## 2020-01-04 LAB — BASIC METABOLIC PANEL
Anion gap: 13 (ref 5–15)
BUN: 18 mg/dL (ref 8–23)
CO2: 25 mmol/L (ref 22–32)
Calcium: 8.9 mg/dL (ref 8.9–10.3)
Chloride: 94 mmol/L — ABNORMAL LOW (ref 98–111)
Creatinine, Ser: 1.25 mg/dL — ABNORMAL HIGH (ref 0.61–1.24)
GFR calc Af Amer: >60 mL/min (ref >60)
GFR calc non Af Amer: 57 mL/min — ABNORMAL LOW (ref >60)
Glucose, Bld: 102 mg/dL — ABNORMAL HIGH (ref 70–99)
Potassium: 4.5 mmol/L (ref 3.5–5.1)
Sodium: 132 mmol/L — ABNORMAL LOW (ref 135–145)

## 2020-01-04 LAB — CBC
HCT: 49.8 % (ref 39.0–52.0)
Hemoglobin: 16.4 g/dL (ref 13.0–17.0)
MCH: 31.6 pg (ref 26.0–34.0)
MCHC: 32.9 g/dL (ref 30.0–36.0)
MCV: 96 fL (ref 80.0–100.0)
Platelets: 150 10*3/uL (ref 150–400)
RBC: 5.19 MIL/uL (ref 4.22–5.81)
RDW: 12.9 % (ref 11.5–15.5)
WBC: 5.5 10*3/uL (ref 4.0–10.5)
nRBC: 0 % (ref 0.0–0.2)

## 2020-01-04 LAB — TROPONIN I (HIGH SENSITIVITY)
Troponin I (High Sensitivity): 21 ng/L — ABNORMAL HIGH (ref <18)
Troponin I (High Sensitivity): 21 ng/L — ABNORMAL HIGH (ref <18)

## 2020-01-04 MED ORDER — ONDANSETRON 4 MG PO TBDP
4.0000 mg | ORAL_TABLET | Freq: Once | ORAL | Status: AC
  Administered 2020-01-04: 4 mg via ORAL
  Filled 2020-01-04: qty 1

## 2020-01-04 NOTE — Discharge Instructions (Signed)
You have been seen for chest pain, shortness of breath.  Lab work and imaging all look reassuring.  I recommend staying hydrated as this will help with your Covid symptoms.  You may also take ibuprofen and/or Tylenol every 6 hours as needed for pain.  Please follow dosing on the back of bottle.  I have consulted with the infusion clinic if you qualify for treatment they will call you for an appointment.  Please keep your phone on you tomorrow in today.  I have also given you the contact information for post Covid care I would like you to call them at your earliest convenience as they will help provide you with further information and guidance with your Covid.  I want you to come back to the emergency department if you develop severe chest pain, shortness of breath, severe abdominal pain, uncontrolled nausea, vomiting, diarrhea as these symptoms require further evaluation management.

## 2020-01-04 NOTE — ED Triage Notes (Signed)
Pt states he tested positive for Covid x 2 days ago and is not having chest pain and sob

## 2020-01-04 NOTE — ED Provider Notes (Signed)
[facility] Provider Note   CSN: [id number] Arrival date & time: 01/04/20  1129     History Chief Complaint  Patient presents with  . Chest Pain    Walter Raymond is a 73 y.o. male.  HPI   Patient with significant medical history of A. fib currently on Xarelto, CAD, pacemaker, presents to the emergency department with chief complaint of chest pain and shortness of breath that is been going on since he was diagnosed with Covid on Monday.  Patient explains his chest has been hurting him, states the pain is localized below his left nipple.   He also states that he had a pacemaker placed and since then he has had chronic chest pain in that area.  He believes the increased pain is also result of him coughing.  Patient is able to palpate that area and elicit pain.  Patient explains he has been coughing more and he feels like he is having a hard time catching his breath sometimes.  He admits to fever, chills, diarrhea but denies nausea or vomiting.  He denies any alleviating or aggravating factors.  Patient denies headache, sore throat, abdominal pain, nausea, vomiting, dysuria, pedal edema.  Past Medical History:  Diagnosis Date  . A-fib ([place])   . Coronary artery disease   . History of cardioversion   . Hypercholesteremia   . Pacemaker   . PTSD (post-traumatic stress disorder)   . S/P cardiac cath 11/18/2013    Patient Active Problem List   Diagnosis Date Noted  . Acute CHF (congestive heart failure) ([place]) 05/07/2019  . Acute respiratory failure with hypoxia ([place]) 05/07/2019  . Atrial fibrillation, chronic ([place]) 05/07/2019  . CAD (coronary artery disease) 05/07/2019  . Essential hypertension 05/07/2019  . Hyperlipidemia 05/07/2019  . History of medication noncompliance 05/07/2019    Past Surgical History:  Procedure Laterality Date  . CARDIAC DEFIBRILLATOR PLACEMENT    . CARDIAC DEFIBRILLATOR PLACEMENT    . PACEMAKER IMPLANT         Family History    Problem Relation Age of Onset  . Heart failure Mother     Social History   Tobacco Use  . Smoking status: Never Smoker  . Smokeless tobacco: Never Used  Substance Use Topics  . Alcohol use: Yes    Alcohol/week: 21.0 standard drinks    Types: 21 Cans of beer per week    Comment: occ  . Drug use: No    Home Medications Prior to Admission medications   Medication Sig Start Date End Date Taking? Authorizing Provider  citalopram (CELEXA) 20 MG tablet Take 20 mg by mouth daily. 04/15/19   [provider]  FLUoxetine (PROZAC) 40 MG capsule Take 40 mg by mouth daily. 12/14/13   [provider]  furosemide (LASIX) 40 MG tablet Take 1 tablet by mouth daily. 12/15/16   [provider]  lisinopril (PRINIVIL,ZESTRIL) 2.5 MG tablet Take 1 tablet by mouth daily. 01/30/17   [provider]  metoprolol succinate (TOPROL-XL) 25 MG 24 hr tablet Take 1 tablet by mouth daily. 09/26/16   [provider]  nitroGLYCERIN (NITROSTAT) 0.4 MG SL tablet Place under the tongue. 08/24/16 08/24/17  [provider]  pravastatin (PRAVACHOL) 20 MG tablet Take 1 tablet by mouth daily. 02/18/17   [provider]  ranitidine (ZANTAC) 150 MG tablet Take 150 mg by mouth at bedtime. 06/20/14   [provider]  simvastatin (ZOCOR) 40 MG tablet Take 40 mg by mouth at  bedtime.    [provider]  sotalol (BETAPACE) 120 MG tablet Take 1 tablet by mouth 2 (two) times daily. 01/28/17   [provider]  sotalol (BETAPACE) 80 MG tablet Take 40 mg by mouth 2 (two) times daily. 01/26/15   [provider]  traZODone (DESYREL) 50 MG tablet Take 50 mg by mouth at bedtime. 01/18/15   [provider]  warfarin (COUMADIN) 5 MG tablet Take 1 tablet by mouth daily. 01/14/17   [provider]  XARELTO 20 MG TABS tablet Take 1 tablet by mouth daily. 04/15/19   [provider]    Allergies    Penicillins  Review of Systems    Review of Systems  Constitutional: Positive for chills and fever.  HENT: Negative for congestion, tinnitus, trouble swallowing and voice change.   Eyes: Negative for visual disturbance.  Respiratory: Positive for cough and shortness of breath.   Cardiovascular: Positive for chest pain.  Gastrointestinal: Positive for diarrhea. Negative for abdominal pain, nausea and vomiting.  Genitourinary: Negative for enuresis, flank pain and frequency.  Musculoskeletal: Positive for myalgias. Negative for back pain.  Skin: Negative for rash.  Neurological: Negative for dizziness and headaches.  Hematological: Does not bruise/bleed easily.    Physical Exam Updated Vital Signs BP 127/68   Pulse 66   Temp 99.1 F (37.3 C) (Oral)   Resp 20   Ht 5\' 8"  (1.727 m)   Wt 77.1 kg   SpO2 96%   BMI 25.85 kg/m   Physical Exam Vitals and nursing note reviewed.  Constitutional:      General: He is not in acute distress.    Appearance: He is not ill-appearing.  HENT:     Head: Normocephalic and atraumatic.     Nose: No congestion.     Mouth/Throat:     Mouth: Mucous membranes are moist.     Pharynx: Oropharynx is clear. No oropharyngeal exudate or posterior oropharyngeal erythema.  Eyes:     General: No scleral icterus. Cardiovascular:     Rate and Rhythm: Normal rate and regular rhythm.     Pulses: Normal pulses.     Heart sounds: No murmur heard.  No friction rub. No gallop.   Pulmonary:     Effort: No respiratory distress.     Breath sounds: No wheezing, rhonchi or rales.     Comments: Patient chest was visualized, good rise and fall during respirations, no flail chest sign noted.  Patient has previous surgical scar and below left nipple.  Area was tender to palpation can elicit chest pain. Abdominal:     General: There is no distension.     Palpations: Abdomen is soft.     Tenderness: There is no abdominal tenderness. There is no right CVA tenderness, left CVA tenderness or guarding.   Musculoskeletal:        General: No swelling.     Right lower leg: No edema.     Left lower leg: No edema.  Skin:    General: Skin is warm and dry.     Capillary Refill: Capillary refill takes less than 2 seconds.     Findings: No rash.  Neurological:     Mental Status: He is alert.  Psychiatric:        Mood and Affect: Mood normal.     ED Results / Procedures / Treatments   Labs (all labs ordered are listed, but only abnormal results are displayed) Labs Reviewed  BASIC METABOLIC PANEL - Abnormal;  Notable for the following components:      Result Value   Sodium 132 (*)    Chloride 94 (*)    Glucose, Bld 102 (*)    Creatinine, Ser 1.25 (*)    GFR calc non Af Amer 57 (*)    All other components within normal limits  TROPONIN I (HIGH SENSITIVITY) - Abnormal; Notable for the following components:   Troponin I (High Sensitivity) 21 (*)    All other components within normal limits  TROPONIN I (HIGH SENSITIVITY) - Abnormal; Notable for the following components:   Troponin I (High Sensitivity) 21 (*)    All other components within normal limits  CBC    EKG EKG Interpretation  Date/Time:  Wednesday January 04 2020 13:55:52 EDT Ventricular Rate:  65 PR Interval:  162 QRS Duration: 146 QT Interval:  448 QTC Calculation: 465 R Axis:   -119 Text Interpretation: AV dual-paced rhythm Biventricular pacemaker detected Abnormal ECG No acute changes Confirmed by Varney Biles [phone]) on 01/04/2020 6:00:10 PM   Radiology DG Chest Portable 1 View  Result Date: 01/04/2020 CLINICAL DATA:  Shortness of breath, COVID-19, coronary artery disease, pacemaker, atrial fibrillation EXAM: PORTABLE CHEST 1 VIEW COMPARISON:  Portable exam 1405 hours compared to 05/06/2019 FINDINGS: LEFT subclavian AICD with leads projecting over RIGHT atrium and RIGHT ventricle. Upper normal heart size. Mediastinal contours and pulmonary vascularity normal. Eventrations of the diaphragms bilaterally. Bronchitic  changes with minimal LEFT basilar scarring. No acute infiltrate, pleural effusion or pneumothorax. No acute osseous findings. IMPRESSION: Bronchitic changes with LEFT basilar scarring. No acute abnormalities. Electronically Signed   By: Lavonia Dana M.D.   On: 01/04/2020 14:24    Procedures Procedures (including critical care time)  Medications Ordered in ED Medications  ondansetron (ZOFRAN-ODT) disintegrating tablet 4 mg (4 mg Oral Given 01/04/20 1703)    ED Course  I have reviewed the triage vital signs and the nursing notes.  Pertinent labs & imaging results that were available during my care of the patient were reviewed by me and considered in my medical decision making (see chart for details).    MDM Rules/Calculators/A&P                          I have personally reviewed all imaging, labs and have interpreted them.  Patient presents with shortness of breath and chest pain since being diagnosed with Covid on Monday.  Patient was alert and oriented did not appear to be acute distress, vital signs reassuring.  Patient's oropharynx was visualized, no exudates or erythema noted, lungs were clear bilaterally, no signs of respiratory distress noted, abdomen was nontender to palpation, no acute abdomen noted, no pedal edema noted on exam.  Will order screening labs and further imaging.  Initial labs reveal chest x-ray with bronchitis changes no acute findings, EKG showing dual paced rhythm, CBC showed no leukocytosis or anemia, BMP with slight hyponatremia, and slight AKI of 1.2.  This is most likely secondary to dehydration.  Initial troponin was 21, second troponin was 21.  I have low suspicion for cardiac abnormality as patient admits chest pain is constant after getting a pacemaker placed in, pain is well localized and is elicited with palpation below his left nipple.  No associated diaphoretic, radiating chest pain, nausea vomiting, no signs of ischemia seen on EKG, troponin first  troponin was 21, second point was 21. Low suspicion for PE or DVT as patient is on Xarelto, no pedal  edema noted on exam, denies pleuritic chest pain, no respiratory distress, vital signs reassuring.  Low suspicion for systemic infection as patient was nontoxic-appearing, no leukocytosis seen on exam, vital signs reassuring.  Low suspicion patient needs to be hospitalized due to Covid as he has no new oxygen requirements, no obvious signs of respiratory distress noted.    Patient appears to be resting comfortably in bed show no acute signs stress, vital signs have remained stable does not meet criteria to be admitted to the hospital.  I suspect patient's pain is result of Covid and I have contacted the infusion clinic as I feel he would benefit from mono body infusions.  They agree and will contact him tonight for an appointment.  Patient was discussed with attending who agrees with assessment and plan.  Patient was given at home care as well strict return precautions.  Patient verbalized that he understood and agreed to plan. Final Clinical Impression(s) / ED Diagnoses Final diagnoses:  [id number] virus infection  Chest wall pain    Rx / DC Orders ED Discharge Orders    None       Marcello Fennel, PA-C 01/04/20 Loretta Plume, MD 01/05/20 1556

## 2020-01-05 ENCOUNTER — Other Ambulatory Visit: Payer: Self-pay | Admitting: Infectious Diseases

## 2020-01-05 ENCOUNTER — Telehealth: Payer: Self-pay | Admitting: Infectious Diseases

## 2020-01-05 DIAGNOSIS — U071 COVID-19: Secondary | ICD-10-CM

## 2020-01-05 DIAGNOSIS — I2510 Atherosclerotic heart disease of native coronary artery without angina pectoris: Secondary | ICD-10-CM

## 2020-01-05 NOTE — Progress Notes (Signed)
I connected by phone with Walter Raymond on 01/05/2020 at 10:53 AM to discuss the potential use of a new treatment for mild to moderate COVID-19 viral infection in non-hospitalized patients.  This patient is a 74 y.o. male that meets the FDA criteria for Emergency Use Authorization of COVID monoclonal antibody casirivimab/imdevimab.  Has a (+) direct SARS-CoV-2 viral test result  Has mild or moderate COVID-19   Is NOT hospitalized due to COVID-19  Is within 10 days of symptom onset  Has at least one of the high risk factor(s) for progression to severe COVID-19 and/or hospitalization as defined in EUA.  Specific high risk criteria : Older age (>/= 72 yo) and Cardiovascular disease or hypertension   I have spoken and communicated the following to the patient or parent/caregiver regarding COVID monoclonal antibody treatment:  1. FDA has authorized the emergency use for the treatment of mild to moderate COVID-19 in adults and pediatric patients with positive results of direct SARS-CoV-2 viral testing who are 37 years of age and older weighing at least 40 kg, and who are at high risk for progressing to severe COVID-19 and/or hospitalization.  2. The significant known and potential risks and benefits of COVID monoclonal antibody, and the extent to which such potential risks and benefits are unknown.  3. Information on available alternative treatments and the risks and benefits of those alternatives, including clinical trials.  4. Patients treated with COVID monoclonal antibody should continue to self-isolate and use infection control measures (e.g., wear mask, isolate, social distance, avoid sharing personal items, clean and disinfect "high touch" surfaces, and frequent handwashing) according to CDC guidelines.   5. The patient or parent/caregiver has the option to accept or refuse COVID monoclonal antibody treatment.  After reviewing this information with the patient, The patient agreed to  proceed with receiving casirivimab\imdevimab infusion and will be provided a copy of the Fact sheet prior to receiving the infusion. Janene Madeira 01/05/2020 10:53 AM

## 2020-01-05 NOTE — Telephone Encounter (Signed)
Called to Discuss with patient about Covid symptoms and the use of the monoclonal antibody infusion for those with mild to moderate Covid symptoms and at a high risk of hospitalization.     Pt appears to qualify for this infusion due to co-morbid conditions and/or a member of an at-risk group in accordance with the FDA Emergency Use Authorization.   Sx started 8/19 and ongoing fatigue. Agrees to infusion    The following was discussed with the patient's wife:    If you have been tested outside of a [facility] - you MUST bring a copy of your positive test with you the morning of your appointment. You may take a photo of this and upload to your MyChart portal or have the testing facility fax the result to [phone]    The address for the infusion clinic site is:  --GPS address is [place] - the parking is located near [employer] building where you will see  COVID19 Infusion feather banner marking the entrance to parking.  (see photos below)           --Enter into the 2nd entrance where the "wave, flag banner" is at the road. Turn into this 2nd entrance and immediately turn left to park in 1 of the 5 parking spots.   --Please stay in your car and call the desk for assistance inside [phone].   --Average time in department is roughly 2 hours for [facility] treatment - this includes preparation of the medication, IV start and the required 1 hour monitoring after the infusion.    Should you develop worsening shortness of breath, chest pain or severe breathing problems please do not wait for this appointment and go to the Emergency room for evaluation and treatment. You will undergo another oxygen screen before your infusion to ensure this is the best treatment option for you. There is a chance that the best decision may be to send you to the Emergency Room for evaluation at the time of your appointment.    The day of your visit you should: Marland Kitchen Get plenty of rest  the night before and drink plenty of water . Eat a light meal/snack before coming and take your medications as prescribed  . Wear warm, comfortable clothes with a shirt that can roll-up over the elbow (will need IV start).  . Wear a mask  . Consider bringing some activity to help pass the time  Many commercial insurers are waiving bills related to [facility] treatment however some have ranged from $300-640. We are starting to see some insurers send bills to patients later for the administration of the medication - we are learning more information but you may receive a bill after your appointment.  Please contact your insurance agent to discuss prior to your appointment if you would like further details about billing specific to your policy.    I hope this helps find you feeling better,  Janene Madeira

## 2020-01-06 ENCOUNTER — Ambulatory Visit (HOSPITAL_COMMUNITY)
Admission: RE | Admit: 2020-01-06 | Discharge: 2020-01-06 | Disposition: A | Discharge status: Home / Self Care | Payer: Medicare Other | Source: Ambulatory Visit | Attending: Pulmonary Disease | Admitting: Pulmonary Disease

## 2020-01-06 DIAGNOSIS — I2510 Atherosclerotic heart disease of native coronary artery without angina pectoris: Secondary | ICD-10-CM | POA: Diagnosis not present

## 2020-01-06 DIAGNOSIS — U071 COVID-19: Secondary | ICD-10-CM | POA: Diagnosis not present

## 2020-01-06 DIAGNOSIS — Z23 Encounter for immunization: Secondary | ICD-10-CM | POA: Insufficient documentation

## 2020-01-06 MED ORDER — DIPHENHYDRAMINE HCL 50 MG/ML IJ SOLN: 50.0000 mg | Freq: Once | INTRAMUSCULAR | Status: DC | PRN

## 2020-01-06 MED ORDER — FAMOTIDINE IN NACL 20-0.9 MG/50ML-% IV SOLN: 20.0000 mg | Freq: Once | INTRAVENOUS | Status: DC | PRN

## 2020-01-06 MED ORDER — ALBUTEROL SULFATE HFA 108 (90 BASE) MCG/ACT IN AERS: 2.0000 | INHALATION_SPRAY | Freq: Once | RESPIRATORY_TRACT | Status: DC | PRN

## 2020-01-06 MED ORDER — SODIUM CHLORIDE 0.9 % IV SOLN: INTRAVENOUS | Status: DC | PRN

## 2020-01-06 MED ORDER — EPINEPHRINE 0.3 MG/0.3ML IJ SOAJ: 0.3000 mg | Freq: Once | INTRAMUSCULAR | Status: DC | PRN

## 2020-01-06 MED ORDER — METHYLPREDNISOLONE SODIUM SUCC 125 MG IJ SOLR: 125.0000 mg | Freq: Once | INTRAMUSCULAR | Status: DC | PRN

## 2020-01-06 MED ORDER — SODIUM CHLORIDE 0.9 % IV SOLN
1200.0000 mg | Freq: Once | INTRAVENOUS | Status: AC
  Administered 2020-01-06: 1200 mg via INTRAVENOUS
  Filled 2020-01-06: qty 10

## 2020-01-06 NOTE — Progress Notes (Signed)
  Diagnosis: COVID-19  Physician:Dr Pattrick Joya Gaskins  Procedure: Covid Infusion Clinic Med: casirivimab\imdevimab infusion - Provided patient with casirivimab\imdevimab fact sheet for patients, parents and caregivers prior to infusion.  Complications: No immediate complications noted.  Discharge: Discharged home   Walter Raymond 01/06/2020

## 2020-01-06 NOTE — Discharge Instructions (Signed)

## 2020-03-12 DIAGNOSIS — I420 Dilated cardiomyopathy: Secondary | ICD-10-CM | POA: Diagnosis not present

## 2020-03-12 DIAGNOSIS — I421 Obstructive hypertrophic cardiomyopathy: Secondary | ICD-10-CM | POA: Diagnosis not present

## 2020-03-12 DIAGNOSIS — I483 Typical atrial flutter: Secondary | ICD-10-CM | POA: Diagnosis not present

## 2020-03-12 DIAGNOSIS — I340 Nonrheumatic mitral (valve) insufficiency: Secondary | ICD-10-CM | POA: Diagnosis not present

## 2020-03-12 DIAGNOSIS — I4819 Other persistent atrial fibrillation: Secondary | ICD-10-CM | POA: Diagnosis not present

## 2020-03-12 DIAGNOSIS — Z95810 Presence of automatic (implantable) cardiac defibrillator: Secondary | ICD-10-CM | POA: Diagnosis not present

## 2020-03-12 DIAGNOSIS — I429 Cardiomyopathy, unspecified: Secondary | ICD-10-CM | POA: Diagnosis not present

## 2020-03-20 DIAGNOSIS — I4819 Other persistent atrial fibrillation: Secondary | ICD-10-CM | POA: Diagnosis not present

## 2020-03-20 DIAGNOSIS — I421 Obstructive hypertrophic cardiomyopathy: Secondary | ICD-10-CM | POA: Diagnosis not present

## 2020-03-20 DIAGNOSIS — I483 Typical atrial flutter: Secondary | ICD-10-CM | POA: Diagnosis not present

## 2020-03-20 DIAGNOSIS — I420 Dilated cardiomyopathy: Secondary | ICD-10-CM | POA: Diagnosis not present

## 2020-03-20 DIAGNOSIS — Z95810 Presence of automatic (implantable) cardiac defibrillator: Secondary | ICD-10-CM | POA: Diagnosis not present

## 2020-03-20 DIAGNOSIS — I340 Nonrheumatic mitral (valve) insufficiency: Secondary | ICD-10-CM | POA: Diagnosis not present

## 2020-04-25 DIAGNOSIS — R040 Epistaxis: Secondary | ICD-10-CM | POA: Diagnosis not present

## 2020-04-25 DIAGNOSIS — R0600 Dyspnea, unspecified: Secondary | ICD-10-CM | POA: Diagnosis not present

## 2020-04-25 DIAGNOSIS — R042 Hemoptysis: Secondary | ICD-10-CM | POA: Diagnosis not present

## 2021-12-09 ENCOUNTER — Other Ambulatory Visit: Payer: Self-pay

## 2021-12-09 ENCOUNTER — Encounter (HOSPITAL_COMMUNITY): Payer: Self-pay | Admitting: *Deleted

## 2021-12-09 ENCOUNTER — Emergency Department (HOSPITAL_COMMUNITY)
Admission: EM | Admit: 2021-12-09 | Discharge: 2021-12-09 | Disposition: A | Discharge status: Home / Self Care | Payer: Medicare Other | Attending: Emergency Medicine | Admitting: Emergency Medicine

## 2021-12-09 ENCOUNTER — Emergency Department (HOSPITAL_COMMUNITY): Payer: Medicare Other

## 2021-12-09 DIAGNOSIS — I2510 Atherosclerotic heart disease of native coronary artery without angina pectoris: Secondary | ICD-10-CM | POA: Diagnosis not present

## 2021-12-09 DIAGNOSIS — Z7901 Long term (current) use of anticoagulants: Secondary | ICD-10-CM | POA: Diagnosis not present

## 2021-12-09 DIAGNOSIS — Z950 Presence of cardiac pacemaker: Secondary | ICD-10-CM | POA: Insufficient documentation

## 2021-12-09 DIAGNOSIS — S93401A Sprain of unspecified ligament of right ankle, initial encounter: Secondary | ICD-10-CM | POA: Diagnosis not present

## 2021-12-09 DIAGNOSIS — S99911A Unspecified injury of right ankle, initial encounter: Secondary | ICD-10-CM | POA: Diagnosis present

## 2021-12-09 DIAGNOSIS — Z79899 Other long term (current) drug therapy: Secondary | ICD-10-CM | POA: Insufficient documentation

## 2021-12-09 DIAGNOSIS — I509 Heart failure, unspecified: Secondary | ICD-10-CM | POA: Insufficient documentation

## 2021-12-09 DIAGNOSIS — M25571 Pain in right ankle and joints of right foot: Secondary | ICD-10-CM

## 2021-12-09 DIAGNOSIS — W1842XA Slipping, tripping and stumbling without falling due to stepping into hole or opening, initial encounter: Secondary | ICD-10-CM | POA: Insufficient documentation

## 2021-12-09 DIAGNOSIS — I110 Hypertensive heart disease with heart failure: Secondary | ICD-10-CM | POA: Insufficient documentation

## 2021-12-09 NOTE — Discharge Instructions (Signed)
Take ibuprofen and Tylenol as needed for pain.  Weight-bear on your right leg as tolerated.  Rest and elevate ankle when possible.  Call the telephone number listed below to schedule a follow-up appointment with the orthopedic doctor.  Return to the emergency department for any worsening severity of symptoms.

## 2021-12-09 NOTE — ED Provider Notes (Signed)
[facility] EMERGENCY DEPARTMENT Provider Note   CSN: [id number] Arrival date & time: 12/09/21  0749     History  Chief Complaint  Patient presents with   Ankle Pain    Walter Raymond is a 76 y.o. male.  HPI Patient presents for right ankle pain.  Medical history includes CAD, atrial fibrillation, HLD, CHF, HTN, pacemaker placement.  He states that, 3 days ago, he stepped in a hole with his right foot but did not experience any immediate pain.  Later that day, he did experience pain in the area of his right ankle.  Pain is worsened with weightbearing and ambulation.  He has taken Tylenol at home for analgesia.  He has been utilizing a cane to offload weight.  Patient denies any other areas of discomfort.  He denies any systemic symptoms.    Home Medications Prior to Admission medications   Medication Sig Start Date End Date Taking? Authorizing Provider  citalopram (CELEXA) 20 MG tablet Take 20 mg by mouth daily. 04/15/19  Yes [provider]  FLUoxetine (PROZAC) 40 MG capsule Take 40 mg by mouth daily. 12/14/13  Yes [provider]  furosemide (LASIX) 40 MG tablet Take 1 tablet by mouth daily. 12/15/16  Yes [provider]  lisinopril (PRINIVIL,ZESTRIL) 2.5 MG tablet Take 1 tablet by mouth daily. 01/30/17  Yes [provider]  metoprolol succinate (TOPROL-XL) 25 MG 24 hr tablet Take 1 tablet by mouth daily. 09/26/16  Yes [provider]  pravastatin (PRAVACHOL) 20 MG tablet Take 1 tablet by mouth daily. 02/18/17  Yes [provider]  ranitidine (ZANTAC) 150 MG tablet Take 150 mg by mouth at bedtime. 06/20/14  Yes [provider]  simvastatin (ZOCOR) 40 MG tablet Take 40 mg by mouth at bedtime.   Yes [provider]  sotalol (BETAPACE) 120 MG tablet Take 1 tablet by mouth 2 (two) times daily. 01/28/17  Yes [provider]  traZODone (DESYREL) 50 MG tablet Take 50 mg by mouth at bedtime. 01/18/15  Yes [provider]  XARELTO 20 MG TABS tablet Take 1 tablet by mouth daily. 04/15/19  Yes [provider]  nitroGLYCERIN (NITROSTAT) 0.4 MG SL tablet Place under the tongue. 08/24/16 08/24/17  [provider]  warfarin (COUMADIN) 5 MG tablet Take 1 tablet by mouth daily. Patient not taking: Reported on 12/09/2021 01/14/17   [provider]      Allergies    Penicillins    Review of Systems   Review of Systems  Musculoskeletal:  Positive for arthralgias.  All other systems reviewed and are negative.   Physical Exam Updated Vital Signs BP 131/88 (BP Location: Right Arm)   Pulse 66   Temp 97.6 F (36.4 C) (Oral)   Resp 18   Ht '5\' 8"'$  (1.727 m)   Wt 81.6 kg   SpO2 99%   BMI 27.37 kg/m  Physical Exam Vitals and nursing note reviewed.  Constitutional:      General: He is not in acute distress.    Appearance: Normal appearance. He is well-developed. He is not ill-appearing, toxic-appearing or diaphoretic.  HENT:     Head: Normocephalic and atraumatic.     Right Ear: External ear normal.     Left Ear: External ear normal.     Nose: Nose normal.     Mouth/Throat:     Mouth: Mucous membranes are moist.     Pharynx: Oropharynx is clear.  Eyes:     Extraocular Movements:  Extraocular movements intact.     Conjunctiva/sclera: Conjunctivae normal.  Cardiovascular:     Rate and Rhythm: Normal rate and regular rhythm.  Pulmonary:     Effort: Pulmonary effort is normal. No respiratory distress.  Abdominal:     General: There is no distension.     Palpations: Abdomen is soft.  Musculoskeletal:        General: Tenderness present. No swelling or deformity. Normal range of motion.     Cervical back: Normal range of motion and neck supple.     Right lower leg: No edema.     Left lower leg: No edema.  Skin:    General: Skin is warm and dry.     Capillary Refill: Capillary refill takes less than 2 seconds.  Neurological:     General: No focal deficit present.      Mental Status: He is alert and oriented to person, place, and time.     Cranial Nerves: No cranial nerve deficit.     Sensory: No sensory deficit.     Motor: No weakness.     Coordination: Coordination normal.  Psychiatric:        Mood and Affect: Mood normal.        Behavior: Behavior normal.        Thought Content: Thought content normal.        Judgment: Judgment normal.     ED Results / Procedures / Treatments   Labs (all labs ordered are listed, but only abnormal results are displayed) Labs Reviewed - No data to display  EKG None  Radiology DG Ankle Complete Right  Result Date: 12/09/2021 CLINICAL DATA:  Pain after stepping in hole EXAM: RIGHT ANKLE - COMPLETE 3+ VIEW COMPARISON:  None Available. FINDINGS: No acute fracture or dislocation. Old fracture deformity of the distal fibula. Corticated ossicles project inferior to lateral and medial malleoli. Ankle mortise intact. Calcaneal spurs. No focal soft tissue swelling. IMPRESSION: No acute fracture or dislocation.  Chronic changes as above. Electronically Signed   By: Lucrezia Europe M.D.   On: 12/09/2021 08:58    Procedures Procedures    Medications Ordered in ED Medications - No data to display  ED Course/ Medical Decision Making/ A&P                           Medical Decision Making  This patient presents to the ED for concern of right ankle pain, this involves an extensive number of treatment options, and is a complaint that carries with it a high risk of complications and morbidity.  The differential diagnosis includes ankle sprain, fracture, gout, hemarthrosis, septic joint   Co morbidities that complicate the patient evaluation  CHF, CAD, HTN, HLD, atrial fibrillation   Additional history obtained:  Additional history obtained from N/A External records from outside source obtained and reviewed including EMR   Imaging Studies ordered:  I ordered imaging studies including right ankle x-ray I independently  visualized and interpreted imaging which showed no acute findings I agree with the radiologist interpretation  Problem List / ED Course / Critical interventions / Medication management  Patient presents for acute right ankle pain.  This has been present for the past 3 days.  Prior to onset of this pain, he did stepped in a hole with his right foot but did not have any immediate pain at that time.  Patient's pain is worse with ambulation and weightbearing.  He has not noticed  any swelling.  On arrival in the ED, he is well-appearing.  On inspection of his ankle, I do not appreciate any swelling.  There is no warmth or skin erythema.  There is a spot of tenderness distal to his medial malleolus.  There are no other areas of tenderness.  Given absence of swelling, I have low suspicion for gout, pseudogout, septic joint, or hemarthrosis.  Patient declined any medications for analgesia while in the ED.  Patient underwent x-ray imaging of his right ankle which did not show any acute fractures.  Patient to be treated for ankle sprain with supportive care and orthopedic follow-up.  A cam boot was placed but this did not appear to help with his ambulation.  Patient was offered crutches but declined.  He stated that he would continue to use his cane.  He was advised to treat pain with ibuprofen and Tylenol and to rest elevate ankle when possible.  He was given contact information for orthopedic follow-up and advised to return to the ED for any new or worsening symptoms.  He was discharged in good condition.   Social Determinants of Health:  Has access to outpatient care         Final Clinical Impression(s) / ED Diagnoses Final diagnoses:  Acute right ankle pain    Rx / DC Orders ED Discharge Orders     None         Godfrey Pick, MD 12/09/21 1050

## 2021-12-09 NOTE — ED Triage Notes (Signed)
Pt c/o right ankle pain x 2 days; pt states he stepped in a hole but did not have pain immediately; pt now c/o pain with walking  No swelling or deformity noted

## 2022-08-27 ENCOUNTER — Other Ambulatory Visit: Payer: Self-pay | Admitting: Otolaryngology

## 2022-08-27 DIAGNOSIS — M25562 Pain in left knee: Secondary | ICD-10-CM

## 2022-08-29 ENCOUNTER — Ambulatory Visit
Admission: RE | Admit: 2022-08-29 | Discharge: 2022-08-29 | Disposition: A | Discharge status: Home / Self Care | Payer: Medicare Other | Source: Ambulatory Visit | Attending: Otolaryngology | Admitting: Otolaryngology

## 2022-08-29 DIAGNOSIS — M25562 Pain in left knee: Secondary | ICD-10-CM

## 2022-12-30 DIAGNOSIS — R6881 Early satiety: Secondary | ICD-10-CM | POA: Diagnosis not present

## 2022-12-30 DIAGNOSIS — R1011 Right upper quadrant pain: Secondary | ICD-10-CM | POA: Diagnosis not present

## 2023-01-05 DIAGNOSIS — M1712 Unilateral primary osteoarthritis, left knee: Secondary | ICD-10-CM | POA: Diagnosis not present

## 2023-01-14 DIAGNOSIS — M1712 Unilateral primary osteoarthritis, left knee: Secondary | ICD-10-CM | POA: Diagnosis not present

## 2023-01-19 DIAGNOSIS — M1712 Unilateral primary osteoarthritis, left knee: Secondary | ICD-10-CM | POA: Diagnosis not present

## 2023-02-02 DIAGNOSIS — M25562 Pain in left knee: Secondary | ICD-10-CM | POA: Diagnosis not present

## 2023-02-09 DIAGNOSIS — E7849 Other hyperlipidemia: Secondary | ICD-10-CM | POA: Diagnosis not present

## 2023-02-09 DIAGNOSIS — R0609 Other forms of dyspnea: Secondary | ICD-10-CM | POA: Diagnosis not present

## 2023-02-09 DIAGNOSIS — I10 Essential (primary) hypertension: Secondary | ICD-10-CM | POA: Diagnosis not present

## 2023-02-09 DIAGNOSIS — Z6830 Body mass index (BMI) 30.0-30.9, adult: Secondary | ICD-10-CM | POA: Diagnosis not present

## 2023-02-09 DIAGNOSIS — M1712 Unilateral primary osteoarthritis, left knee: Secondary | ICD-10-CM | POA: Diagnosis not present

## 2023-02-23 DIAGNOSIS — I429 Cardiomyopathy, unspecified: Secondary | ICD-10-CM | POA: Diagnosis not present

## 2023-02-23 DIAGNOSIS — Z95810 Presence of automatic (implantable) cardiac defibrillator: Secondary | ICD-10-CM | POA: Diagnosis not present

## 2023-03-16 DIAGNOSIS — Z95810 Presence of automatic (implantable) cardiac defibrillator: Secondary | ICD-10-CM | POA: Diagnosis not present

## 2023-03-16 DIAGNOSIS — I420 Dilated cardiomyopathy: Secondary | ICD-10-CM | POA: Diagnosis not present

## 2023-03-16 DIAGNOSIS — I4819 Other persistent atrial fibrillation: Secondary | ICD-10-CM | POA: Diagnosis not present

## 2023-03-16 DIAGNOSIS — I429 Cardiomyopathy, unspecified: Secondary | ICD-10-CM | POA: Diagnosis not present

## 2023-03-16 DIAGNOSIS — I421 Obstructive hypertrophic cardiomyopathy: Secondary | ICD-10-CM | POA: Diagnosis not present

## 2023-03-16 DIAGNOSIS — I483 Typical atrial flutter: Secondary | ICD-10-CM | POA: Diagnosis not present

## 2023-03-18 DIAGNOSIS — R5382 Chronic fatigue, unspecified: Secondary | ICD-10-CM | POA: Diagnosis not present

## 2023-03-18 DIAGNOSIS — E1165 Type 2 diabetes mellitus with hyperglycemia: Secondary | ICD-10-CM | POA: Diagnosis not present

## 2023-03-18 DIAGNOSIS — E7849 Other hyperlipidemia: Secondary | ICD-10-CM | POA: Diagnosis not present

## 2023-03-31 DIAGNOSIS — I421 Obstructive hypertrophic cardiomyopathy: Secondary | ICD-10-CM | POA: Diagnosis not present

## 2023-03-31 DIAGNOSIS — I483 Typical atrial flutter: Secondary | ICD-10-CM | POA: Diagnosis not present

## 2023-03-31 DIAGNOSIS — I429 Cardiomyopathy, unspecified: Secondary | ICD-10-CM | POA: Diagnosis not present

## 2023-03-31 DIAGNOSIS — I420 Dilated cardiomyopathy: Secondary | ICD-10-CM | POA: Diagnosis not present

## 2023-03-31 DIAGNOSIS — Z95810 Presence of automatic (implantable) cardiac defibrillator: Secondary | ICD-10-CM | POA: Diagnosis not present

## 2023-03-31 DIAGNOSIS — I4819 Other persistent atrial fibrillation: Secondary | ICD-10-CM | POA: Diagnosis not present

## 2023-04-01 DIAGNOSIS — I4891 Unspecified atrial fibrillation: Secondary | ICD-10-CM | POA: Diagnosis not present

## 2023-04-01 DIAGNOSIS — F32A Depression, unspecified: Secondary | ICD-10-CM | POA: Diagnosis not present

## 2023-04-01 DIAGNOSIS — K219 Gastro-esophageal reflux disease without esophagitis: Secondary | ICD-10-CM | POA: Diagnosis not present

## 2023-04-01 DIAGNOSIS — I10 Essential (primary) hypertension: Secondary | ICD-10-CM | POA: Diagnosis not present

## 2023-04-01 DIAGNOSIS — Z6830 Body mass index (BMI) 30.0-30.9, adult: Secondary | ICD-10-CM | POA: Diagnosis not present

## 2023-04-01 DIAGNOSIS — R1011 Right upper quadrant pain: Secondary | ICD-10-CM | POA: Diagnosis not present

## 2023-04-01 DIAGNOSIS — G4700 Insomnia, unspecified: Secondary | ICD-10-CM | POA: Diagnosis not present

## 2023-04-01 DIAGNOSIS — E7849 Other hyperlipidemia: Secondary | ICD-10-CM | POA: Diagnosis not present

## 2023-04-01 DIAGNOSIS — E6609 Other obesity due to excess calories: Secondary | ICD-10-CM | POA: Diagnosis not present

## 2023-04-25 ENCOUNTER — Encounter (HOSPITAL_COMMUNITY): Payer: Self-pay

## 2023-04-25 ENCOUNTER — Emergency Department (HOSPITAL_COMMUNITY): Payer: Medicare PPO

## 2023-04-25 ENCOUNTER — Other Ambulatory Visit: Payer: Self-pay

## 2023-04-25 ENCOUNTER — Emergency Department (HOSPITAL_COMMUNITY)
Admission: EM | Admit: 2023-04-25 | Discharge: 2023-04-25 | Disposition: A | Discharge status: Home / Self Care | Payer: Medicare PPO | Attending: Emergency Medicine | Admitting: Emergency Medicine

## 2023-04-25 DIAGNOSIS — R059 Cough, unspecified: Secondary | ICD-10-CM | POA: Diagnosis not present

## 2023-04-25 DIAGNOSIS — Z95810 Presence of automatic (implantable) cardiac defibrillator: Secondary | ICD-10-CM | POA: Diagnosis not present

## 2023-04-25 DIAGNOSIS — J189 Pneumonia, unspecified organism: Secondary | ICD-10-CM

## 2023-04-25 DIAGNOSIS — J181 Lobar pneumonia, unspecified organism: Secondary | ICD-10-CM | POA: Insufficient documentation

## 2023-04-25 DIAGNOSIS — Z20822 Contact with and (suspected) exposure to covid-19: Secondary | ICD-10-CM | POA: Diagnosis not present

## 2023-04-25 DIAGNOSIS — R918 Other nonspecific abnormal finding of lung field: Secondary | ICD-10-CM | POA: Diagnosis not present

## 2023-04-25 DIAGNOSIS — R042 Hemoptysis: Secondary | ICD-10-CM | POA: Diagnosis not present

## 2023-04-25 DIAGNOSIS — R0981 Nasal congestion: Secondary | ICD-10-CM | POA: Diagnosis present

## 2023-04-25 DIAGNOSIS — J168 Pneumonia due to other specified infectious organisms: Secondary | ICD-10-CM | POA: Diagnosis not present

## 2023-04-25 DIAGNOSIS — Z7901 Long term (current) use of anticoagulants: Secondary | ICD-10-CM | POA: Diagnosis not present

## 2023-04-25 DIAGNOSIS — R0989 Other specified symptoms and signs involving the circulatory and respiratory systems: Secondary | ICD-10-CM | POA: Diagnosis not present

## 2023-04-25 DIAGNOSIS — I4891 Unspecified atrial fibrillation: Secondary | ICD-10-CM | POA: Insufficient documentation

## 2023-04-25 LAB — BASIC METABOLIC PANEL
Anion gap: 10 (ref 5–15)
BUN: 18 mg/dL (ref 8–23)
CO2: 26 mmol/L (ref 22–32)
Calcium: 9.1 mg/dL (ref 8.9–10.3)
Chloride: 98 mmol/L (ref 98–111)
Creatinine, Ser: 1.13 mg/dL (ref 0.61–1.24)
GFR, Estimated: >60 mL/min (ref >60)
Glucose, Bld: 119 mg/dL — ABNORMAL HIGH (ref 70–99)
Potassium: 4.4 mmol/L (ref 3.5–5.1)
Sodium: 134 mmol/L — ABNORMAL LOW (ref 135–145)

## 2023-04-25 LAB — CBC WITH DIFFERENTIAL/PLATELET
Abs Immature Granulocytes: 0.06 10*3/uL (ref 0.00–0.07)
Basophils Absolute: 0.1 10*3/uL (ref 0.0–0.1)
Basophils Relative: 0 %
Eosinophils Absolute: 0 10*3/uL (ref 0.0–0.5)
Eosinophils Relative: 0 %
HCT: 43.6 % (ref 39.0–52.0)
Hemoglobin: 14.7 g/dL (ref 13.0–17.0)
Immature Granulocytes: 0 %
Lymphocytes Relative: 15 %
Lymphs Abs: 2.1 10*3/uL (ref 0.7–4.0)
MCH: 31.2 pg (ref 26.0–34.0)
MCHC: 33.7 g/dL (ref 30.0–36.0)
MCV: 92.6 fL (ref 80.0–100.0)
Monocytes Absolute: 1.7 10*3/uL — ABNORMAL HIGH (ref 0.1–1.0)
Monocytes Relative: 12 %
Neutro Abs: 10 10*3/uL — ABNORMAL HIGH (ref 1.7–7.7)
Neutrophils Relative %: 73 %
Platelets: 167 10*3/uL (ref 150–400)
RBC: 4.71 MIL/uL (ref 4.22–5.81)
RDW: 12.7 % (ref 11.5–15.5)
WBC: 13.8 10*3/uL — ABNORMAL HIGH (ref 4.0–10.5)
nRBC: 0 % (ref 0.0–0.2)

## 2023-04-25 LAB — RESP PANEL BY RT-PCR (RSV, FLU A&B, COVID)  RVPGX2
Influenza A by PCR: NEGATIVE
Influenza B by PCR: NEGATIVE
Resp Syncytial Virus by PCR: NEGATIVE
SARS Coronavirus 2 by RT PCR: NEGATIVE

## 2023-04-25 MED ORDER — LEVOFLOXACIN 750 MG PO TABS
750.0000 mg | ORAL_TABLET | Freq: Once | ORAL | Status: AC
  Administered 2023-04-25: 750 mg via ORAL
  Filled 2023-04-25: qty 1

## 2023-04-25 MED ORDER — IOHEXOL 350 MG/ML SOLN
75.0000 mL | Freq: Once | INTRAVENOUS | Status: AC | PRN
  Administered 2023-04-25: 75 mL via INTRAVENOUS

## 2023-04-25 MED ORDER — LEVOFLOXACIN 750 MG PO TABS: 750.0000 mg | ORAL_TABLET | Freq: Every day | ORAL | 0 refills | Status: DC

## 2023-04-25 MED ORDER — BENZONATATE 100 MG PO CAPS: 100.0000 mg | ORAL_CAPSULE | Freq: Three times a day (TID) | ORAL | 0 refills | Status: DC

## 2023-04-25 NOTE — ED Triage Notes (Signed)
Pt reports cough, sore throat, nasal congestion since Wednesday and had a coughing spell this morning and has been coughing up some blood since then.

## 2023-04-25 NOTE — ED Provider Notes (Signed)
[place] EMERGENCY DEPARTMENT AT [facility] Provider Note   CSN: [id number] Arrival date & time: 04/25/23  1219     History  Chief Complaint  Patient presents with   URI    Walter Raymond is a 77 y.o. male.  He presents to ER complaining of coughing up blood.  He started having cough and congestion several days ago and this morning he woke up every time he coughed there was some blood mixed with the sputum.  He states it was somewhere around a teaspoon if he had to try to quantify the amount and bright red.  He is on Xarelto for atrial fibrillation.  Denies chest pain or shortness of breath.  Denies fevers or chills, no nausea or vomiting.  He states this happened multiple times this morning.  No pain or swelling.  URI      Home Medications Prior to Admission medications   Medication Sig Start Date End Date Taking? Authorizing Provider  benzonatate (TESSALON) 100 MG capsule Take 1 capsule (100 mg total) by mouth every 8 (eight) hours. 04/25/23  Yes Kashana Breach A, PA-C  levofloxacin (LEVAQUIN) 750 MG tablet Take 1 tablet (750 mg total) by mouth daily. 04/26/23  Yes Barbaraann Avans A, PA-C  citalopram (CELEXA) 20 MG tablet Take 20 mg by mouth daily. 04/15/19   [provider]  FLUoxetine (PROZAC) 40 MG capsule Take 40 mg by mouth daily. 12/14/13   [provider]  furosemide (LASIX) 40 MG tablet Take 1 tablet by mouth daily. 12/15/16   [provider]  lisinopril (PRINIVIL,ZESTRIL) 2.5 MG tablet Take 1 tablet by mouth daily. 01/30/17   [provider]  metoprolol succinate (TOPROL-XL) 25 MG 24 hr tablet Take 1 tablet by mouth daily. 09/26/16   [provider]  nitroGLYCERIN (NITROSTAT) 0.4 MG SL tablet Place under the tongue. 08/24/16 08/24/17  [provider]  pravastatin (PRAVACHOL) 20 MG tablet Take 1 tablet by mouth daily. 02/18/17   [provider]  ranitidine (ZANTAC) 150 MG tablet Take 150 mg by mouth at  bedtime. 06/20/14   [provider]  simvastatin (ZOCOR) 40 MG tablet Take 40 mg by mouth at bedtime.    [provider]  sotalol (BETAPACE) 120 MG tablet Take 1 tablet by mouth 2 (two) times daily. 01/28/17   [provider]  traZODone (DESYREL) 50 MG tablet Take 50 mg by mouth at bedtime. 01/18/15   [provider]  warfarin (COUMADIN) 5 MG tablet Take 1 tablet by mouth daily. Patient not taking: Reported on 12/09/2021 01/14/17   [provider]  XARELTO 20 MG TABS tablet Take 1 tablet by mouth daily. 04/15/19   [provider]      Allergies    Oxycodone and Penicillins    Review of Systems   Review of Systems  Physical Exam Updated Vital Signs BP 103/60   Pulse 60   Temp 99.4 F (37.4 C) (Oral)   Resp 20   Ht 5\' 8"  (1.727 m)   Wt 86.2 kg   SpO2 92%   BMI 28.89 kg/m  Physical Exam Vitals and nursing note reviewed.  Constitutional:      General: He is not in acute distress.    Appearance: He is well-developed.  HENT:     Head: Normocephalic and atraumatic.  Eyes:     Conjunctiva/sclera: Conjunctivae normal.  Cardiovascular:     Rate and Rhythm: Normal rate and regular rhythm.     Heart  sounds: No murmur heard. Pulmonary:     Effort: Pulmonary effort is normal. No respiratory distress.     Breath sounds: Normal breath sounds.  Abdominal:     Palpations: Abdomen is soft.     Tenderness: There is no abdominal tenderness.  Musculoskeletal:        General: No swelling.     Cervical back: Neck supple.  Skin:    General: Skin is warm and dry.     Capillary Refill: Capillary refill takes less than 2 seconds.  Neurological:     General: No focal deficit present.     Mental Status: He is alert and oriented to person, place, and time.  Psychiatric:        Mood and Affect: Mood normal.     ED Results / Procedures / Treatments   Labs (all labs ordered are listed, but only abnormal results are displayed) Labs Reviewed   BASIC METABOLIC PANEL - Abnormal; Notable for the following components:      Result Value   Sodium 134 (*)    Glucose, Bld 119 (*)    All other components within normal limits  CBC WITH DIFFERENTIAL/PLATELET - Abnormal; Notable for the following components:   WBC 13.8 (*)    Neutro Abs 10.0 (*)    Monocytes Absolute 1.7 (*)    All other components within normal limits  RESP PANEL BY RT-PCR (RSV, FLU A&B, COVID)  [id number]    EKG None  Radiology CT Angio Chest PE W and/or Wo Contrast Result Date: 04/25/2023 CLINICAL DATA:  Pulmonary embolism (PE) suspected, high prob. Cough, nasal congestion. EXAM: CT ANGIOGRAPHY CHEST WITH CONTRAST TECHNIQUE: Multidetector CT imaging of the chest was performed using the standard protocol during bolus administration of intravenous contrast. Multiplanar CT image reconstructions and MIPs were obtained to evaluate the vascular anatomy. RADIATION DOSE REDUCTION: This exam was performed according to the departmental dose-optimization program which includes automated exposure control, adjustment of the mA and/or kV according to patient size and/or use of iterative reconstruction technique. CONTRAST:  75mL OMNIPAQUE IOHEXOL 350 MG/ML SOLN COMPARISON:  None Available. FINDINGS: Cardiovascular: No filling defects in the pulmonary arteries to suggest pulmonary emboli. Left side pacer in place. Heart is normal size. Aorta is normal caliber. Mediastinum/Nodes: No mediastinal, hilar, or axillary adenopathy. Trachea and esophagus are unremarkable. Thyroid unremarkable. Lungs/Pleura: Consolidation in the left lower lobe compatible with pneumonia. No confluent opacity on the right. No effusions. Upper Abdomen: No acute findings Musculoskeletal: Chest wall soft tissues are unremarkable. No acute bony abnormality. Review of the MIP images confirms the above findings. IMPRESSION: Left lower lobe consolidation compatible with pneumonia. No evidence of pulmonary embolus. Electronically  Signed   By: Charlett Nose M.D.   On: 04/25/2023 17:45   DG Chest Port 1 View Result Date: 04/25/2023 CLINICAL DATA:  Hemoptysis. EXAM: PORTABLE CHEST 1 VIEW COMPARISON:  01/04/2020 FINDINGS: AICD remains in place. Very low lung volumes are seen. Atelectasis or infiltrate noted in the left lung base, with silhouetting the left hemidiaphragm. Right lung remains clear. IMPRESSION: Very low lung volumes.  Left basilar atelectasis or infiltrate. Electronically Signed   By: Danae Orleans M.D.   On: 04/25/2023 14:14    Procedures Procedures    Medications Ordered in ED Medications  iohexol (OMNIPAQUE) 350 MG/ML injection 75 mL (75 mLs Intravenous Contrast Given 04/25/23 1607)  levofloxacin (LEVAQUIN) tablet 750 mg (750 mg Oral Given 04/25/23 1828)    ED Course/ Medical Decision Making/ A&P Clinical Course as  of 04/25/23 1840  Sat Apr 25, 2023  1821 Patient had several episodes of hemoptysis today, but a teaspoon of blood mixed with sputum each time.  Xarelto for A-fib started with cough and congestion several days ago.  Hemoglobin and vitals are stable.  CT of his chest shows left lower lobe pneumonia, no PE ambulatory pulse ox did not drop below 93%.  I did this myself.  He does not feel short of breath with ambulation or become tachycardic.  He does not meet sepsis criteria.  Patient is requesting to go home.  Given that he is not septic, has not coughed up any further blood here and has reassuring labs and vitals I feel this is appropriate.  Fortunately he does have anaphylaxis to amoxicillin so he will be treated with Levaquin.  He was to follow with his PCP on Monday and he was given strict return precautions. [CB]    Clinical Course User Index [CB] Ma Rings, PA-C                                 Medical Decision Making This patient presents to the ED for concern of hemoptysis, this involves an extensive number of treatment options, and is a complaint that carries with it a high risk  of complications and morbidity.  The differential diagnosis includes pneumonia, orchitis, PE, malignancy, other   Co morbidities that complicate the patient evaluation :   A-fib on Xarelto   Additional history obtained:  Additional history obtained from EMR External records from outside source obtained and reviewed including prior notes and labs   Lab Tests:  I Ordered, and personally interpreted labs.  The pertinent results include: Leukocytosis 13.8, hemoglobin is 14.7, BMP normal, COVID flu RSV negative   Imaging Studies ordered:  I ordered imaging studies including x-ray which shows low lung volumes, possible consolidation left lower lobe versus atelectasis; CTA chest shows no PE but does show left lower lobe consolidation compatible with pneumonia I independently visualized and interpreted imaging within scope of identifying emergent findings  I agree with the radiologist interpretation    Problem List / ED Course / Critical interventions / Medication management  Hemoptysis-patient having cough that today became productive of sputum mixed with blood he is on Eliquis.  He has a leukocytosis but no fever or tachycardia or tachypnea.  Oxygen saturation even on ambulation does not drop below 93%.  He is able to ambulate around the halls without difficulty.  He is not having hemoptysis here and with stable vitals and stable hemoglobin patient is agreeable with discharge home and will defer this over admission.  Given his allergy of hives with penicillin will give 5-day course of Levaquin.  Patient has no history of QT prolongation.  We discussed that he needs to come back to the ER if he has fever, coughs up large amount of blood, gets dizzy, develops chest pain or trouble breathing or has any other new or worsening symptoms otherwise follow-up with his primary care doctor Monday.  I have reviewed the patients home medicines and have made adjustments as needed      Amount and/or  Complexity of Data Reviewed Labs: ordered. Radiology: ordered.  Risk Prescription drug management.           Final Clinical Impression(s) / ED Diagnoses Final diagnoses:  Pneumonia of left lower lobe due to infectious organism    Rx / DC Orders  ED Discharge Orders          Ordered    levofloxacin (LEVAQUIN) 750 MG tablet  Daily        04/25/23 1826    benzonatate (TESSALON) 100 MG capsule  Every 8 hours        04/25/23 1819              Josem Kaufmann 04/25/23 1840    Terrilee Files, MD 04/26/23 [phone]

## 2023-04-25 NOTE — Discharge Instructions (Signed)
It was a pleasure taking care of you today.  You seen for cough with blood in your sputum.  Your blood work shows an elevated white blood cell count but your hemoglobin level is normal.  You have pneumonia on your CT scan.  This is likely the cause of your symptoms and the bleeding is probably coming from you being on blood thinners.  This should get better with antibiotic treatment, your oxygen saturation is normal and you do not have a fever, at this time it is safe for you to go home but if you start coughing up more blood, feel dizzy, become short of breath, have chest pain or any other worrisome changes you come back to the ER right away.  Follow-up with your primary care doctor on Monday.

## 2023-05-12 DIAGNOSIS — R030 Elevated blood-pressure reading, without diagnosis of hypertension: Secondary | ICD-10-CM | POA: Diagnosis not present

## 2023-05-12 DIAGNOSIS — J069 Acute upper respiratory infection, unspecified: Secondary | ICD-10-CM | POA: Diagnosis not present

## 2023-05-12 DIAGNOSIS — Z20828 Contact with and (suspected) exposure to other viral communicable diseases: Secondary | ICD-10-CM | POA: Diagnosis not present

## 2023-05-12 DIAGNOSIS — J189 Pneumonia, unspecified organism: Secondary | ICD-10-CM | POA: Diagnosis not present

## 2023-05-12 DIAGNOSIS — Z6828 Body mass index (BMI) 28.0-28.9, adult: Secondary | ICD-10-CM | POA: Diagnosis not present

## 2023-06-02 DIAGNOSIS — R059 Cough, unspecified: Secondary | ICD-10-CM | POA: Diagnosis not present

## 2023-06-15 DIAGNOSIS — Z20828 Contact with and (suspected) exposure to other viral communicable diseases: Secondary | ICD-10-CM | POA: Diagnosis not present

## 2023-06-15 DIAGNOSIS — R509 Fever, unspecified: Secondary | ICD-10-CM | POA: Diagnosis not present

## 2023-06-15 DIAGNOSIS — Z6828 Body mass index (BMI) 28.0-28.9, adult: Secondary | ICD-10-CM | POA: Diagnosis not present

## 2023-06-15 DIAGNOSIS — J205 Acute bronchitis due to respiratory syncytial virus: Secondary | ICD-10-CM | POA: Diagnosis not present

## 2023-06-15 DIAGNOSIS — Z2089 Contact with and (suspected) exposure to other communicable diseases: Secondary | ICD-10-CM | POA: Diagnosis not present

## 2023-07-15 DIAGNOSIS — M1712 Unilateral primary osteoarthritis, left knee: Secondary | ICD-10-CM | POA: Diagnosis not present

## 2023-08-10 DIAGNOSIS — I483 Typical atrial flutter: Secondary | ICD-10-CM | POA: Diagnosis not present

## 2023-08-10 DIAGNOSIS — R0602 Shortness of breath: Secondary | ICD-10-CM | POA: Diagnosis not present

## 2023-08-10 DIAGNOSIS — Z95810 Presence of automatic (implantable) cardiac defibrillator: Secondary | ICD-10-CM | POA: Diagnosis not present

## 2023-08-10 DIAGNOSIS — I420 Dilated cardiomyopathy: Secondary | ICD-10-CM | POA: Diagnosis not present

## 2023-08-10 DIAGNOSIS — I421 Obstructive hypertrophic cardiomyopathy: Secondary | ICD-10-CM | POA: Diagnosis not present

## 2023-08-10 DIAGNOSIS — Z01810 Encounter for preprocedural cardiovascular examination: Secondary | ICD-10-CM | POA: Diagnosis not present

## 2023-08-10 DIAGNOSIS — I4819 Other persistent atrial fibrillation: Secondary | ICD-10-CM | POA: Diagnosis not present

## 2023-08-17 DIAGNOSIS — Z6828 Body mass index (BMI) 28.0-28.9, adult: Secondary | ICD-10-CM | POA: Diagnosis not present

## 2023-08-17 DIAGNOSIS — Z20822 Contact with and (suspected) exposure to covid-19: Secondary | ICD-10-CM | POA: Diagnosis not present

## 2023-08-17 DIAGNOSIS — R059 Cough, unspecified: Secondary | ICD-10-CM | POA: Diagnosis not present

## 2023-08-17 DIAGNOSIS — U071 COVID-19: Secondary | ICD-10-CM | POA: Diagnosis not present

## 2023-08-17 DIAGNOSIS — Z2089 Contact with and (suspected) exposure to other communicable diseases: Secondary | ICD-10-CM | POA: Diagnosis not present

## 2023-09-02 DIAGNOSIS — I420 Dilated cardiomyopathy: Secondary | ICD-10-CM | POA: Diagnosis not present

## 2023-09-02 DIAGNOSIS — I428 Other cardiomyopathies: Secondary | ICD-10-CM | POA: Diagnosis not present

## 2023-09-02 DIAGNOSIS — Z1330 Encounter for screening examination for mental health and behavioral disorders, unspecified: Secondary | ICD-10-CM | POA: Diagnosis not present

## 2023-09-02 DIAGNOSIS — I421 Obstructive hypertrophic cardiomyopathy: Secondary | ICD-10-CM | POA: Diagnosis not present

## 2023-09-02 DIAGNOSIS — I4819 Other persistent atrial fibrillation: Secondary | ICD-10-CM | POA: Diagnosis not present

## 2023-09-10 DIAGNOSIS — I420 Dilated cardiomyopathy: Secondary | ICD-10-CM | POA: Diagnosis not present

## 2023-09-10 DIAGNOSIS — R0602 Shortness of breath: Secondary | ICD-10-CM | POA: Diagnosis not present

## 2023-09-17 DIAGNOSIS — I429 Cardiomyopathy, unspecified: Secondary | ICD-10-CM | POA: Diagnosis not present

## 2023-09-17 DIAGNOSIS — I420 Dilated cardiomyopathy: Secondary | ICD-10-CM | POA: Diagnosis not present

## 2023-09-17 DIAGNOSIS — R634 Abnormal weight loss: Secondary | ICD-10-CM | POA: Diagnosis not present

## 2023-09-17 DIAGNOSIS — Z95810 Presence of automatic (implantable) cardiac defibrillator: Secondary | ICD-10-CM | POA: Diagnosis not present

## 2023-09-17 DIAGNOSIS — E785 Hyperlipidemia, unspecified: Secondary | ICD-10-CM | POA: Diagnosis not present

## 2023-09-17 DIAGNOSIS — I421 Obstructive hypertrophic cardiomyopathy: Secondary | ICD-10-CM | POA: Diagnosis not present

## 2023-09-17 DIAGNOSIS — I483 Typical atrial flutter: Secondary | ICD-10-CM | POA: Diagnosis not present

## 2023-09-17 DIAGNOSIS — I4819 Other persistent atrial fibrillation: Secondary | ICD-10-CM | POA: Diagnosis not present

## 2023-09-17 DIAGNOSIS — I10 Essential (primary) hypertension: Secondary | ICD-10-CM | POA: Diagnosis not present

## 2023-09-17 DIAGNOSIS — Z0001 Encounter for general adult medical examination with abnormal findings: Secondary | ICD-10-CM | POA: Diagnosis not present

## 2023-09-23 ENCOUNTER — Ambulatory Visit: Payer: Self-pay | Admitting: Orthopedic Surgery

## 2023-09-28 DIAGNOSIS — F32A Depression, unspecified: Secondary | ICD-10-CM | POA: Diagnosis not present

## 2023-09-28 DIAGNOSIS — F411 Generalized anxiety disorder: Secondary | ICD-10-CM | POA: Diagnosis not present

## 2023-09-28 DIAGNOSIS — I4891 Unspecified atrial fibrillation: Secondary | ICD-10-CM | POA: Diagnosis not present

## 2023-09-28 DIAGNOSIS — Z6828 Body mass index (BMI) 28.0-28.9, adult: Secondary | ICD-10-CM | POA: Diagnosis not present

## 2023-09-28 DIAGNOSIS — E782 Mixed hyperlipidemia: Secondary | ICD-10-CM | POA: Diagnosis not present

## 2023-09-30 NOTE — Progress Notes (Addendum)
 COVID Vaccine received:  [x]  No []  Yes Date of any COVID positive Test in last 90 days: none  PCP - Alanna Hu, MD [phone] (Work)  [phone] (Fax)  [facility]-    Cardiologist - Courtenay Distel, MD at [facility]. [phone] (Work) [phone] (Fax)    Cardiac clearance scanned to [facility] and is in [facility] note 3-31-20205  Chest x-ray -  EKG -  Requested 09-17-23 EKG at [facility]  Stress Test - 09-10-23 cardiolite  [facility] ECHO -  Cardiac Cath - 11-18-2013  at [facility] CT Coronary Calcium score:   Pacemaker / AICD device []  No [x]  Yes  Medtronic [id number]  CLARIA MRT CRT-D the serial number is [id number]. Placed 06-06-2019 at [facility]   Device order faxed to [facility] 09-30-23  Spinal Cord Stimulator:[x]  No []  Yes       History of Sleep Apnea? [x]  No []  Yes   CPAP used?- [x]  No []  Yes    Does the patient monitor blood sugar?   []  N/A   [x]  No []  Yes  Patient has: []  NO Hx DM   [x]  Pre-DM   []  DM1  []   DM2   No Meds Last A1c was: 5.7  on    09-02-23   Blood Thinner / Instructions: Xarelto   Hold x 3 days    Called Lori Stone to confirm, left VM.  Aspirin  Instructions:  none  ERAS Protocol Ordered: []  No  [x]  Yes PRE-SURGERY []  ENSURE  [x]  G2   Patient is to be NPO after: 0815  Dental hx: []  Dentures:  [x]  N/A      []  Bridge or Partial:                   []  Loose or Damaged teeth:   Comments: Patient was given the 5 CHG shower / bath instructions for  TKA surgery along with 2 bottles of the CHG soap. Patient will start this on:      10-10-23        Activity level: Able to walk up 2 flights of stairs without becoming significantly short of breath or having chest pain?  []  No   [x]    Yes   Anesthesia review: HOCM s/p ETOH ablation, CHF, A.fib, HTN, Anxiety, PTSD ([place] Vet), Pre-DM,   AICD- last checked 09-17-23 by Dr. Brenita Callow at [facility]  Patient denies shortness of breath, fever, cough and chest pain at PAT appointment.  Patient verbalized understanding and agreement to the  Pre-Surgical Instructions that were given to them at this PAT appointment. Patient was also educated of the need to review these PAT instructions again prior to his surgery.I reviewed the appropriate phone numbers to call if they have any and questions or concerns.

## 2023-09-30 NOTE — Patient Instructions (Addendum)
 SURGICAL WAITING ROOM VISITATION Patients having surgery or a procedure may have no more than 2 support people in the waiting area - these visitors may rotate in the visitor waiting room.   If the patient needs to stay at the hospital during part of their recovery, the visitor guidelines for inpatient rooms apply.  PRE-OP VISITATION  Pre-op nurse will coordinate an appropriate time for 1 support person to accompany the patient in pre-op.  This support person may not rotate.  This visitor will be contacted when the time is appropriate for the visitor to come back in the pre-op area.  Please refer to the [facility] website for the visitor guidelines for Inpatients (after your surgery is over and you are in a regular room).  You are not required to quarantine at this time prior to your surgery. However, you must do this: Hand Hygiene often Do NOT share personal items Notify your provider if you are in close contact with someone who has COVID or you develop fever 100.4 or greater, new onset of sneezing, cough, sore throat, shortness of breath or body aches.  If you test positive for Covid or have been in contact with anyone that has tested positive in the last 10 days please notify you surgeon.    Your procedure is scheduled on:  Wednesday  October 14, 2023  Report to [facility] Main Entrance: [address] entrance where the [employer] is available.   Report to admitting at:  08:45   AM  Call this number if you have any questions or problems the morning of surgery [phone]  Do not eat food after Midnight the night prior to your surgery/procedure.  After Midnight you may have the following liquids until   08:15  AM  DAY OF SURGERY  Clear Liquid Diet Water Black Coffee (sugar ok, NO MILK/CREAM OR CREAMERS)  Tea (sugar ok, NO MILK/CREAM OR CREAMERS) regular and decaf                             Plain Jell-O  with no fruit (NO RED)                                           Fruit  ices (not with fruit pulp, NO RED)                                     Popsicles (NO RED)                                                                  Juice: NO CITRUS JUICES: only apple, WHITE grape, WHITE cranberry Sports drinks like Gatorade or Powerade (NO RED)                   The day of surgery:  Drink ONE (1) Pre-Surgery G2 at    08:15    AM the morning of surgery. Drink in one sitting. Do not sip.  This drink was given to you during your hospital pre-op appointment  visit. Nothing else to drink after completing the Pre-Surgery G2 : No candy, chewing gum or throat lozenges.    FOLLOW ANY ADDITIONAL PRE OP INSTRUCTIONS YOU RECEIVED FROM YOUR SURGEON'S OFFICE!!!   Oral Hygiene is also important to reduce your risk of infection.        Remember - BRUSH YOUR TEETH THE MORNING OF SURGERY WITH YOUR REGULAR TOOTHPASTE  Do NOT smoke after Midnight the night before surgery.  STOP TAKING all Vitamins, Herbs and supplements 1 week before your surgery.   XARELTO - Stop taking       hours before your surgery.  Last dose will be taken on :  Take ONLY these medicines the morning of surgery with A SIP OF WATER:   Sotalol  (Betapace ) Citalopram  (Celexa )    You may not have any metal on your body including jewelry, and body piercing  Do not wear  lotions, powders,  cologne, or deodorant  Men may shave face and neck.  Contacts, Hearing Aids, dentures or bridgework may not be worn into surgery. DENTURES WILL BE REMOVED PRIOR TO SURGERY PLEASE DO NOT APPLY "Poly grip" OR ADHESIVES!!!  You may bring a small overnight bag with you on the day of surgery, only pack items that are not valuable. [facility] IS NOT RESPONSIBLE   FOR VALUABLES THAT ARE LOST OR STOLEN.   Do not bring your home medications to the hospital. The Pharmacy will dispense medications listed on your medication list to you during your admission in the Hospital.  Special Instructions: Bring a copy of your healthcare power  of attorney and living will documents the day of surgery, if you wish to have them scanned into your [facility] Records- EPIC  Please read over the following fact sheets you were given: IF YOU HAVE QUESTIONS ABOUT YOUR PRE-OP INSTRUCTIONS, PLEASE CALL [phone].     Pre-operative 5 CHG Bath Instructions   You can play a key role in reducing the risk of infection after surgery. Your skin needs to be as free of germs as possible. You can reduce the number of germs on your skin by washing with CHG (chlorhexidine gluconate) soap before surgery. CHG is an antiseptic soap that kills germs and continues to kill germs even after washing.   DO NOT use if you have an allergy to chlorhexidine/CHG or antibacterial soaps. If your skin becomes reddened or irritated, stop using the CHG and notify one of our RNs at [phone]  Please shower with the CHG soap starting 4 days before surgery using the following schedule: START SHOWERS ON           SUNDAY    Oct 10, 2023  Please keep in mind the following:  DO NOT shave, including legs and underarms, starting the day of your first shower.   You may shave your face at any point before/day of surgery.   Place clean sheets on your bed the day you start using CHG soap. Use a clean washcloth (not used since being washed) for each shower. DO NOT sleep with pets once you start using the CHG.   CHG Shower Instructions:  If you choose to wash your hair and private area, wash first with your normal shampoo/soap.  After you use shampoo/soap, rinse your hair and body thoroughly to remove shampoo/soap residue.  Turn the water OFF and apply about 3 tablespoons (45 ml) of CHG soap to a CLEAN washcloth.  Apply CHG soap ONLY FROM YOUR NECK DOWN TO YOUR TOES (washing for 3-5 minutes)  DO NOT  use CHG soap on face, private areas, open wounds, or sores.  Pay special attention to the area where your surgery is being performed.  If you are having back surgery, having someone wash your back for you may be helpful.  Wait 2 minutes after CHG soap is applied, then you may rinse off the CHG soap.  Pat dry with a clean towel  Put on clean clothes/pajamas   If you choose to wear lotion, please use ONLY the CHG-compatible lotions on the back of this paper.     Additional instructions for the day of surgery: DO NOT APPLY any lotions, deodorants, cologne, or perfumes.   Put on clean/comfortable clothes.  Brush your teeth.  Ask your nurse before applying any prescription medications to the skin.      CHG Compatible Lotions   Aveeno Moisturizing lotion  Cetaphil Moisturizing Cream  Cetaphil Moisturizing Lotion  Clairol Herbal Essence Moisturizing Lotion, Dry Skin  Clairol Herbal Essence Moisturizing Lotion, Extra Dry Skin  Clairol Herbal Essence Moisturizing Lotion, Normal Skin  Curel Age Defying Therapeutic Moisturizing Lotion with Alpha Hydroxy  Curel Extreme Care Body Lotion  Curel Soothing Hands Moisturizing Hand Lotion  Curel Therapeutic Moisturizing Cream, Fragrance-Free  Curel Therapeutic Moisturizing Lotion, Fragrance-Free  Curel Therapeutic Moisturizing Lotion, Original Formula  Eucerin Daily Replenishing Lotion  Eucerin Dry Skin Therapy Plus Alpha Hydroxy Crme  Eucerin Dry Skin Therapy Plus Alpha Hydroxy Lotion  Eucerin Original Crme  Eucerin Original Lotion  Eucerin Plus Crme Eucerin Plus Lotion  Eucerin TriLipid Replenishing Lotion  Keri Anti-Bacterial Hand Lotion  Keri Deep Conditioning Original Lotion Dry Skin Formula Softly Scented  Keri Deep Conditioning Original Lotion, Fragrance Free Sensitive Skin Formula  Keri Lotion Fast Absorbing Fragrance Free Sensitive Skin Formula  Keri Lotion Fast Absorbing Softly Scented Dry Skin Formula  Keri Original Lotion   Keri Skin Renewal Lotion Keri Silky Smooth Lotion  Keri Silky Smooth Sensitive Skin Lotion  Nivea Body Creamy Conditioning Oil  Nivea Body Extra Enriched Lotion  Nivea Body Original Lotion  Nivea Body Sheer Moisturizing Lotion Nivea Crme  Nivea Skin Firming Lotion  NutraDerm 30 Skin Lotion  NutraDerm Skin Lotion  NutraDerm Therapeutic Skin Cream  NutraDerm Therapeutic Skin Lotion  ProShield Protective Hand Cream  Provon moisturizing lotion   FAILURE TO FOLLOW THESE INSTRUCTIONS MAY RESULT IN THE CANCELLATION OF YOUR SURGERY  PATIENT SIGNATURE_________________________________  NURSE SIGNATURE__________________________________  ________________________________________________________________________         Benjamen Brand    An incentive spirometer is a tool that can help keep your lungs clear and active. This tool measures how well you are filling your lungs with each  breath. Taking long deep breaths may help reverse or decrease the chance of developing breathing (pulmonary) problems (especially infection) following: A long period of time when you are unable to move or be active. BEFORE THE PROCEDURE  If the spirometer includes an indicator to show your best effort, your nurse or respiratory therapist will set it to a desired goal. If possible, sit up straight or lean slightly forward. Try not to slouch. Hold the incentive spirometer in an upright position. INSTRUCTIONS FOR USE  Sit on the edge of your bed if possible, or sit up as far as you can in bed or on a chair. Hold the incentive spirometer in an upright position. Breathe out normally. Place the mouthpiece in your mouth and seal your lips tightly around it. Breathe in slowly and as deeply as possible, raising the piston or the ball toward the top of the column. Hold your breath for 3-5 seconds or for as long as possible. Allow the piston or ball to fall to the bottom of the column. Remove the mouthpiece from  your mouth and breathe out normally. Rest for a few seconds and repeat Steps 1 through 7 at least 10 times every 1-2 hours when you are awake. Take your time and take a few normal breaths between deep breaths. The spirometer may include an indicator to show your best effort. Use the indicator as a goal to work toward during each repetition. After each set of 10 deep breaths, practice coughing to be sure your lungs are clear. If you have an incision (the cut made at the time of surgery), support your incision when coughing by placing a pillow or rolled up towels firmly against it. Once you are able to get out of bed, walk around indoors and cough well. You may stop using the incentive spirometer when instructed by your caregiver.  RISKS AND COMPLICATIONS Take your time so you do not get dizzy or light-headed. If you are in pain, you may need to take or ask for pain medication before doing incentive spirometry. It is harder to take a deep breath if you are having pain. AFTER USE Rest and breathe slowly and easily. It can be helpful to keep track of a log of your progress. Your caregiver can provide you with a simple table to help with this. If you are using the spirometer at home, follow these instructions: SEEK MEDICAL CARE IF:  You are having difficultly using the spirometer. You have trouble using the spirometer as often as instructed. Your pain medication is not giving enough relief while using the spirometer. You develop fever of 100.5 F (38.1 C) or higher.                                                                                                    SEEK IMMEDIATE MEDICAL CARE IF:  You cough up bloody sputum that had not been present before. You develop fever of 102 F (38.9 C) or greater. You develop worsening pain at or near the incision site. MAKE SURE YOU:  Understand these instructions. Will watch your  condition. Will get help right away if you are not doing well or get  worse. Document Released: 09/08/2006 Document Revised: 07/21/2011 Document Reviewed: 11/09/2006 [facility] Patient Information 2014 [place], [place].        If you would like to see a video about joint replacement:   [email or web address]

## 2023-10-01 ENCOUNTER — Encounter (HOSPITAL_COMMUNITY): Payer: Self-pay

## 2023-10-01 ENCOUNTER — Encounter (HOSPITAL_COMMUNITY)
Admission: RE | Admit: 2023-10-01 | Discharge: 2023-10-01 | Disposition: A | Discharge status: Home / Self Care | Source: Ambulatory Visit | Attending: Specialist | Admitting: Specialist

## 2023-10-01 ENCOUNTER — Other Ambulatory Visit: Payer: Self-pay

## 2023-10-01 VITALS — BP 108/60 | HR 62 | Temp 98.3°F | Resp 16 | Ht 68.0 in | Wt 179.0 lb

## 2023-10-01 DIAGNOSIS — I4891 Unspecified atrial fibrillation: Secondary | ICD-10-CM | POA: Insufficient documentation

## 2023-10-01 DIAGNOSIS — Z01818 Encounter for other preprocedural examination: Secondary | ICD-10-CM

## 2023-10-01 DIAGNOSIS — I517 Cardiomegaly: Secondary | ICD-10-CM | POA: Insufficient documentation

## 2023-10-01 DIAGNOSIS — I10 Essential (primary) hypertension: Secondary | ICD-10-CM | POA: Insufficient documentation

## 2023-10-01 DIAGNOSIS — Z95810 Presence of automatic (implantable) cardiac defibrillator: Secondary | ICD-10-CM | POA: Insufficient documentation

## 2023-10-01 DIAGNOSIS — I2510 Atherosclerotic heart disease of native coronary artery without angina pectoris: Secondary | ICD-10-CM | POA: Diagnosis not present

## 2023-10-01 DIAGNOSIS — R7303 Prediabetes: Secondary | ICD-10-CM

## 2023-10-01 DIAGNOSIS — M1712 Unilateral primary osteoarthritis, left knee: Secondary | ICD-10-CM | POA: Insufficient documentation

## 2023-10-01 HISTORY — DX: Essential (primary) hypertension: I10

## 2023-10-01 HISTORY — DX: Prediabetes: R73.03

## 2023-10-01 HISTORY — DX: Presence of automatic (implantable) cardiac defibrillator: Z95.810

## 2023-10-01 HISTORY — DX: Gastro-esophageal reflux disease without esophagitis: K21.9

## 2023-10-01 HISTORY — DX: Heart failure, unspecified: I50.9

## 2023-10-01 HISTORY — DX: Pneumonia, unspecified organism: J18.9

## 2023-10-01 HISTORY — DX: Unspecified osteoarthritis, unspecified site: M19.90

## 2023-10-01 HISTORY — DX: Cardiac arrhythmia, unspecified: I49.9

## 2023-10-01 LAB — CBC
HCT: 48.4 % (ref 39.0–52.0)
Hemoglobin: 15.6 g/dL (ref 13.0–17.0)
MCH: 31.1 pg (ref 26.0–34.0)
MCHC: 32.2 g/dL (ref 30.0–36.0)
MCV: 96.6 fL (ref 80.0–100.0)
Platelets: 197 10*3/uL (ref 150–400)
RBC: 5.01 MIL/uL (ref 4.22–5.81)
RDW: 13.8 % (ref 11.5–15.5)
WBC: 6 10*3/uL (ref 4.0–10.5)
nRBC: 0 % (ref 0.0–0.2)

## 2023-10-01 LAB — COMPREHENSIVE METABOLIC PANEL WITH GFR
ALT: 15 U/L (ref 0–44)
AST: 19 U/L (ref 15–41)
Albumin: 3.8 g/dL (ref 3.5–5.0)
Alkaline Phosphatase: 67 U/L (ref 38–126)
Anion gap: 8 (ref 5–15)
BUN: 18 mg/dL (ref 8–23)
CO2: 28 mmol/L (ref 22–32)
Calcium: 8.9 mg/dL (ref 8.9–10.3)
Chloride: 98 mmol/L (ref 98–111)
Creatinine, Ser: 1.02 mg/dL (ref 0.61–1.24)
GFR, Estimated: >60 mL/min (ref >60)
Glucose, Bld: 90 mg/dL (ref 70–99)
Potassium: 4.2 mmol/L (ref 3.5–5.1)
Sodium: 134 mmol/L — ABNORMAL LOW (ref 135–145)
Total Bilirubin: 1.4 mg/dL — ABNORMAL HIGH (ref 0.0–1.2)
Total Protein: 7.3 g/dL (ref 6.5–8.1)

## 2023-10-01 LAB — SURGICAL PCR SCREEN
MRSA, PCR: POSITIVE — AB
Staphylococcus aureus: POSITIVE — AB

## 2023-10-01 NOTE — Progress Notes (Signed)
 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 Appropriate notes have been placed on the patient's chart. This note has been routed to Dr. Leighton Punches and Jaclyn Bissell, PA-C for review. The Patient's surgery is currently scheduled for: 10-14-23 at [facility].  Morrie Arbour, BSN, CVRN-BC   Pre-Surgical Testing Nurse [facility]  [phone]

## 2023-10-02 NOTE — Progress Notes (Signed)
 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 Orvan Blanch.   Pt last seen by cardiology 09/17/2023. Per OV note, "He is stable with no angina or dyspnea. He has low risk stress test and is active with no angina or dyspnea. He can proceed with knee surgery."  Low risk stress test 09/10/2023.   Follows with heart failure clinic. Last seen 09/02/2023. Per OV note pt stable, started on Entresto at this visit.  VS: BP 108/60 Comment: left arm sitting  Pulse 62   Temp 36.8 C (Oral)   Resp 16   Ht 5\' 8"  (1.727 m)   Wt 81.2 kg   SpO2 100%   BMI 27.22 kg/m   PROVIDERS: Sasser, Ky Phillips, MD is PCP   Cardiologist - Courtenay Distel, MD  LABS: Labs reviewed: Acceptable for surgery. (all labs ordered are listed, but only abnormal results are displayed)  Labs Reviewed  SURGICAL PCR SCREEN - Abnormal; Notable for the following components:      Result Value   MRSA, PCR POSITIVE (*)    Staphylococcus aureus POSITIVE (*)    All other components within normal limits  COMPREHENSIVE METABOLIC PANEL WITH GFR - Abnormal; Notable for the following components:   Sodium 134 (*)    Total Bilirubin 1.4 (*)    All other components within normal limits  CBC     IMAGES:   EKG:   CV: Echo 03/31/2023 (Care Everywhere) Left Ventricle: EF: 40-45%.    Left Ventricle: There is moderate  hypokinesis of the left ventricle.    Aortic Valve: Mild aortic valve regurgitation with centrally directed  jet.   Technically difficult study with limited visualization of the  endocardial  borders and structures.   Left ventricular systolic function appears moderately reduced with global  hypokinesis.   The visually estimated ejection fraction is 40 to 45%.  There is moderate calcification and thickening of the aortic valve with  decreased excursion.  Significant stenosis is not present based on  pressure gradients.  However, the severity of the gradients across the  aortic valve are likely underestimated due to the underlying left  ventricular systolic dysfunction.    Echo 05/07/2019  1. Left ventricular ejection fraction, by visual estimation, is 35-40%.  The left ventricle has mild to moderately decreased function. There is  mild left ventricular hypertrophy.   2. Severe hypokinesis of the left ventricular, basal anteroseptal wall,  consistent with previous septal ablation.   3. Severe hypokinesis of the left ventricular inferior wall.   4. Abnormal septal motion consistent with RV pacemaker.   5. The left ventricle demonstrates regional wall motion abnormalities.   6. Left ventricular diastolic parameters are consistent with Grade I  diastolic dysfunction (impaired relaxation).   7. Global right ventricle has normal systolic function.The right  ventricular size is normal. No increase in right ventricular wall  thickness.   8. A pacer wire is visualized.   9. Left atrial size was mildly dilated.  10. Right atrial size was normal.  11. The mitral  valve is normal in structure. No evidence of mitral valve  regurgitation. No evidence of mitral stenosis.  12. The tricuspid valve is normal in structure.  13. The aortic valve is normal in structure. Aortic valve regurgitation is  not visualized. No evidence of aortic valve sclerosis or stenosis.  14. The pulmonic valve was normal in structure. Pulmonic valve  regurgitation is not visualized.  15. The inferior vena cava is normal in size with greater than 50%  respiratory variability, suggesting right atrial  pressure of 3 mmHg.   Past Medical History:  Diagnosis Date   A-fib [facility])    AICD (automatic cardioverter/defibrillator) present    new generator    Medtronic Claria MRT CRT-D   Arthritis    CHF (congestive heart failure) (HCC)    Coronary artery disease    Dysrhythmia    A.fib   GERD (gastroesophageal reflux disease)    History of cardioversion    Hypercholesteremia    Hypertension    Pneumonia    Pre-diabetes    PTSD (post-traumatic stress disorder)    from [place]   S/P cardiac cath 11/18/2013    Past Surgical History:  Procedure Laterality Date   CARDIAC CATHETERIZATION     multiple,   last one done at 11-18-2013 at [facility]   CARDIAC DEFIBRILLATOR PLACEMENT  1990   original generator   CARDIAC DEFIBRILLATOR PLACEMENT  06/06/2019   new generator placed  Medtronic Claria MRI  CRT-D   HERNIA REPAIR Left    inguinal hernia w/ mesh   TONSILLECTOMY      MEDICATIONS:  citalopram  (CELEXA ) 40 MG tablet   ENTRESTO 24-26 MG   furosemide  (LASIX ) 40 MG tablet   nitroGLYCERIN  (NITROSTAT ) 0.4 MG SL tablet   pravastatin (PRAVACHOL) 40 MG tablet   sotalol  (BETAPACE ) 120 MG tablet   traZODone (DESYREL) 50 MG tablet   XARELTO  20 MG TABS tablet   No current facility-administered medications for this encounter.    Chick Cotton Ward, PA-C [facility] Pre-Surgical Testing [phone]

## 2023-10-07 ENCOUNTER — Ambulatory Visit: Payer: Self-pay | Admitting: Orthopedic Surgery

## 2023-10-08 ENCOUNTER — Ambulatory Visit: Payer: Self-pay | Admitting: Orthopedic Surgery

## 2023-10-08 NOTE — H&P (Signed)
 Walter Raymond is an 78 y.o. male.   Chief Complaint: left knee pain HPI: Walter Raymond is here today for his history and physical. He is scheduled for 10/14/2023 for a left total knee arthroplasty at [facility]  Dr. Leighton Punches and the patient mutually agreed to proceed with a total knee replacement. Risks and benefits of the procedure were discussed including stiffness, suboptimal range of motion, persistent pain, infection requiring removal of prosthesis and reinsertion, need for prophylactic antibiotics in the future, for example, dental procedures, possible need for manipulation, revision in the future and also anesthetic complications including DVT, PE, etc. We discussed the perioperative course, time in the hospital, postoperative recovery and the need for elevation to control swelling. We also discussed the predicted range of motion and the probability that squatting and kneeling would be unobtainable in the future. In addition, postoperative anticoagulation was discussed. We have obtained preoperative medical clearance as necessary. Provided illustrated handout and discussed it in detail. They will enroll in the total joint replacement educational forum at the hospital.  He is scheduled for his preop at [facility] tomorrow. Unable to tolerate oxy due to nausea vomiting.  Past Medical History:  Diagnosis Date   A-fib [facility])    AICD (automatic cardioverter/defibrillator) present    new generator    Medtronic Claria MRT CRT-D   Arthritis    CHF (congestive heart failure) (HCC)    Coronary artery disease    Dysrhythmia    A.fib   GERD (gastroesophageal reflux disease)    History of cardioversion    Hypercholesteremia    Hypertension    Pneumonia    Pre-diabetes    PTSD (post-traumatic stress disorder)    from [place]   S/P cardiac cath 11/18/2013    Past Surgical History:  Procedure Laterality Date   CARDIAC CATHETERIZATION     multiple,   last one done at 11-18-2013 at [facility]    CARDIAC DEFIBRILLATOR PLACEMENT  1990   original generator   CARDIAC DEFIBRILLATOR PLACEMENT  06/06/2019   new generator placed  Medtronic Claria MRI  CRT-D   HERNIA REPAIR Left    inguinal hernia w/ mesh   TONSILLECTOMY      Family History  Problem Relation Age of Onset   Heart failure Mother    Social History:  reports that he has never smoked. He has never used smokeless tobacco. He reports that he does not currently use alcohol  after a past usage of about 21.0 standard drinks of alcohol  per week. He reports that he does not use drugs.  Allergies:  Allergies  Allergen Reactions   Oxycodone  Hives   Penicillins Hives    (Not in a hospital admission)   No results found for this or any previous visit (from the past 48 hours). No results found.  Review of Systems  Constitutional: Negative.   HENT: Negative.    Eyes: Negative.   Respiratory: Negative.    Cardiovascular: Negative.   Gastrointestinal: Negative.   Endocrine: Negative.   Genitourinary: Negative.   Musculoskeletal:  Positive for arthralgias, gait problem, joint swelling and myalgias.  Skin: Negative.   Psychiatric/Behavioral: Negative.      There were no vitals taken for this visit. Physical Exam Constitutional:      Appearance: Normal appearance.  HENT:     Head: Normocephalic and atraumatic.     Right Ear: External ear normal.     Left Ear: External ear normal.     Nose: Nose normal.  Mouth/Throat:     Pharynx: Oropharynx is clear.  Eyes:     Conjunctiva/sclera: Conjunctivae normal.  Cardiovascular:     Rate and Rhythm: Normal rate and regular rhythm.     Pulses: Normal pulses.     Heart sounds: Normal heart sounds.  Pulmonary:     Effort: Pulmonary effort is normal.     Breath sounds: Normal breath sounds.  Abdominal:     General: Bowel sounds are normal.  Musculoskeletal:     Cervical back: Normal range of motion and neck supple.     Comments: Walks an antalgic gait exquisitely tender  left medial joint line trace effusion is ranges 0-1 30. Patellofemoral pain compression is noted as well. Good quad strength ipsilateral hip and ankle exam is unremarkable  Skin:    General: Skin is warm and dry.  Neurological:     Mental Status: He is alert.    Repeat PA bent knee weightbearing demonstrates progressive severe medial joint space narrowing. 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 Leighton Punches on June 4. We again discussed the procedure itself as well as risks, complications and alternatives, including but not limited to DVT, PE, infx, bleeding, failure of procedure, need for secondary procedure including manipulation, nerve injury, ongoing pain/symptoms, anesthesia risk, even stroke or death. Also discussed typical post-op protocols, activity restrictions, need for PT, flexion/extension exercises, time out of work. Discussed need for DVT ppx post-op per protocol. Discussed dental ppx and infx prevention. Also discussed limitations post-operatively such as kneeling and squatting. All questions were answered. Patient desires to proceed with surgery as scheduled.  Will hold Xarelto  accordingly. Will remain NPO after midnight the night before surgery. Will present to [facility] for pre-op testing. Anticipate hospital stay to include at least 2 midnights given medical history and to ensure proper pain control. Plan resume Xarelto  for DVT ppx post-op. Plan hydrocodone  versus p.o. Dilaudid , tizanidine, Colace, Miralax. Plan outpatient PT postop, already scheduled in [place] for June 9. Will follow up 10-14 days post-op for staple removal and xrays. Vanco and Gent ordered. 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  Plan left total knee replacement  Walter Levin, PA-C for Dr Leighton Punches 10/08/2023, 1:14 PM

## 2023-10-08 NOTE — H&P (View-Only) (Signed)
 Walter Raymond is an 78 y.o. male.   Chief Complaint: left knee pain HPI: Walter Raymond is here today for his history and physical. He is scheduled for 10/14/2023 for a left total knee arthroplasty at [facility]  Dr. Leighton Punches and the patient mutually agreed to proceed with a total knee replacement. Risks and benefits of the procedure were discussed including stiffness, suboptimal range of motion, persistent pain, infection requiring removal of prosthesis and reinsertion, need for prophylactic antibiotics in the future, for example, dental procedures, possible need for manipulation, revision in the future and also anesthetic complications including DVT, PE, etc. We discussed the perioperative course, time in the hospital, postoperative recovery and the need for elevation to control swelling. We also discussed the predicted range of motion and the probability that squatting and kneeling would be unobtainable in the future. In addition, postoperative anticoagulation was discussed. We have obtained preoperative medical clearance as necessary. Provided illustrated handout and discussed it in detail. They will enroll in the total joint replacement educational forum at the hospital.  He is scheduled for his preop at [facility] tomorrow. Unable to tolerate oxy due to nausea vomiting.  Past Medical History:  Diagnosis Date   A-fib [facility])    AICD (automatic cardioverter/defibrillator) present    new generator    Medtronic Claria MRT CRT-D   Arthritis    CHF (congestive heart failure) (HCC)    Coronary artery disease    Dysrhythmia    A.fib   GERD (gastroesophageal reflux disease)    History of cardioversion    Hypercholesteremia    Hypertension    Pneumonia    Pre-diabetes    PTSD (post-traumatic stress disorder)    from [place]   S/P cardiac cath 11/18/2013    Past Surgical History:  Procedure Laterality Date   CARDIAC CATHETERIZATION     multiple,   last one done at 11-18-2013 at [facility]    CARDIAC DEFIBRILLATOR PLACEMENT  1990   original generator   CARDIAC DEFIBRILLATOR PLACEMENT  06/06/2019   new generator placed  Medtronic Claria MRI  CRT-D   HERNIA REPAIR Left    inguinal hernia w/ mesh   TONSILLECTOMY      Family History  Problem Relation Age of Onset   Heart failure Mother    Social History:  reports that he has never smoked. He has never used smokeless tobacco. He reports that he does not currently use alcohol  after a past usage of about 21.0 standard drinks of alcohol  per week. He reports that he does not use drugs.  Allergies:  Allergies  Allergen Reactions   Oxycodone  Hives   Penicillins Hives    (Not in a hospital admission)   No results found for this or any previous visit (from the past 48 hours). No results found.  Review of Systems  Constitutional: Negative.   HENT: Negative.    Eyes: Negative.   Respiratory: Negative.    Cardiovascular: Negative.   Gastrointestinal: Negative.   Endocrine: Negative.   Genitourinary: Negative.   Musculoskeletal:  Positive for arthralgias, gait problem, joint swelling and myalgias.  Skin: Negative.   Psychiatric/Behavioral: Negative.      There were no vitals taken for this visit. Physical Exam Constitutional:      Appearance: Normal appearance.  HENT:     Head: Normocephalic and atraumatic.     Right Ear: External ear normal.     Left Ear: External ear normal.     Nose: Nose normal.  Mouth/Throat:     Pharynx: Oropharynx is clear.  Eyes:     Conjunctiva/sclera: Conjunctivae normal.  Cardiovascular:     Rate and Rhythm: Normal rate and regular rhythm.     Pulses: Normal pulses.     Heart sounds: Normal heart sounds.  Pulmonary:     Effort: Pulmonary effort is normal.     Breath sounds: Normal breath sounds.  Abdominal:     General: Bowel sounds are normal.  Musculoskeletal:     Cervical back: Normal range of motion and neck supple.     Comments: Walks an antalgic gait exquisitely tender  left medial joint line trace effusion is ranges 0-1 30. Patellofemoral pain compression is noted as well. Good quad strength ipsilateral hip and ankle exam is unremarkable  Skin:    General: Skin is warm and dry.  Neurological:     Mental Status: He is alert.    Repeat PA bent knee weightbearing demonstrates progressive severe medial joint space narrowing. 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 Leighton Punches on June 4. We again discussed the procedure itself as well as risks, complications and alternatives, including but not limited to DVT, PE, infx, bleeding, failure of procedure, need for secondary procedure including manipulation, nerve injury, ongoing pain/symptoms, anesthesia risk, even stroke or death. Also discussed typical post-op protocols, activity restrictions, need for PT, flexion/extension exercises, time out of work. Discussed need for DVT ppx post-op per protocol. Discussed dental ppx and infx prevention. Also discussed limitations post-operatively such as kneeling and squatting. All questions were answered. Patient desires to proceed with surgery as scheduled.  Will hold Xarelto  accordingly. Will remain NPO after midnight the night before surgery. Will present to [facility] for pre-op testing. Anticipate hospital stay to include at least 2 midnights given medical history and to ensure proper pain control. Plan resume Xarelto  for DVT ppx post-op. Plan hydrocodone  versus p.o. Dilaudid , tizanidine, Colace, Miralax. Plan outpatient PT postop, already scheduled in [place] for June 9. Will follow up 10-14 days post-op for staple removal and xrays. Vanco and Gent ordered. 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  Plan left total knee replacement  Walter Levin, PA-C for Dr Leighton Punches 10/08/2023, 1:14 PM

## 2023-10-09 NOTE — Anesthesia Preprocedure Evaluation (Addendum)
 Anesthesia Evaluation  Patient identified by MRN, date of birth, ID band Patient awake    Reviewed: Allergy & Precautions, NPO status , Patient's Chart, lab work & pertinent test results  Airway Mallampati: I  TM Distance: >3 FB Neck ROM: Full    Dental no notable dental hx. (+) Teeth Intact, Dental Advisory Given   Pulmonary neg pulmonary ROS   Pulmonary exam normal breath sounds clear to auscultation       Cardiovascular hypertension, Pt. 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    Left Ventricle: There is moderate  hypokinesis of the left ventricle.    Aortic Valve: Mild aortic valve regurgitation with centrally directed  jet.  Technically difficult study with limited visualization of the endocardial  borders and structures.   Left ventricular systolic function appears moderately reduced with global  hypokinesis.   The visually estimated ejection fraction is 40 to 45%.  There is moderate calcification and thickening of the aortic valve with  decreased excursion.  Significant stenosis is not present based on  pressure gradients.  However, the severity of the gradients across the  aortic valve are likely underestimated due to the underlying left  ventricular systolic dysfunction.      Echo 05/07/2019  1. Left ventricular ejection fraction, by visual estimation, is 35-40%.  The left ventricle has mild to moderately decreased function. There is  mild left ventricular hypertrophy.   2. Severe hypokinesis of the left ventricular, basal anteroseptal wall,  consistent with previous septal ablation.   3. Severe hypokinesis of the left ventricular inferior wall.   4. Abnormal septal motion consistent with RV pacemaker.   5. The left  ventricle demonstrates regional wall motion abnormalities.   6. Left ventricular diastolic parameters are consistent with Grade I  diastolic dysfunction (impaired relaxation).   7. Global right ventricle has normal systolic function.The right  ventricular size is normal. No increase in right ventricular wall  thickness.   8. A pacer wire is visualized.   9. Left atrial size was mildly dilated.  10. Right atrial size was normal.  11. The mitral valve is normal in structure. No evidence of mitral valve  regurgitation. No evidence of mitral stenosis.  12. The tricuspid valve is normal in structure.  13. The aortic valve is normal in structure. Aortic valve regurgitation is  not visualized. No evidence of aortic valve sclerosis or stenosis.  14. The pulmonic valve was normal in structure. Pulmonic valve  regurgitation is not visualized.  15. The inferior vena cava is normal in size with greater than 50%  respiratory variability, suggesting right atrial pressure of 3 mmHg.  LVOT Vmax:   106.00 cm/s  LVOT Vmean:  76.600 cm/s     Neuro/Psych  PSYCHIATRIC DISORDERS Anxiety     negative neurological ROS     GI/Hepatic Neg liver ROS,GERD  ,,  Endo/Other  negative endocrine ROS    Renal/GU negative Renal ROS  negative genitourinary   Musculoskeletal  (+) Arthritis ,    Abdominal   Peds  Hematology  (+) Blood dyscrasia (xarelto )   Anesthesia Other Findings   Reproductive/Obstetrics                             Anesthesia Physical Anesthesia Plan  ASA: 4  Anesthesia Plan: General  and Regional   Post-op Pain Management: Regional block* and Tylenol  PO (pre-op)*   Induction: Intravenous  PONV Risk Score and Plan: 2 and Dexamethasone , Ondansetron  and Treatment may vary due to age or medical condition  Airway Management Planned: Oral ETT  Additional Equipment: Arterial line  Intra-op Plan:   Post-operative Plan: Extubation in OR  Informed  Consent: I have reviewed the patients History and Physical, chart, labs and discussed the procedure including the risks, benefits and alternatives for the proposed anesthesia with the patient or authorized representative who has indicated his/her understanding and acceptance.     Dental advisory given  Plan Discussed with: CRNA  Anesthesia Plan Comments: (See PAT note 10/01/23)       Anesthesia Quick Evaluation

## 2023-10-13 ENCOUNTER — Encounter (HOSPITAL_COMMUNITY): Payer: Self-pay | Admitting: Specialist

## 2023-10-14 ENCOUNTER — Ambulatory Visit (HOSPITAL_COMMUNITY): Payer: Self-pay | Admitting: Anesthesiology

## 2023-10-14 ENCOUNTER — Ambulatory Visit (HOSPITAL_COMMUNITY)

## 2023-10-14 ENCOUNTER — Ambulatory Visit (HOSPITAL_COMMUNITY)
Admission: RE | Admit: 2023-10-14 | Discharge: 2023-10-15 | Disposition: A | Discharge status: Home / Self Care | Attending: Specialist | Admitting: Specialist

## 2023-10-14 ENCOUNTER — Other Ambulatory Visit: Payer: Self-pay

## 2023-10-14 ENCOUNTER — Encounter (HOSPITAL_COMMUNITY): Payer: Self-pay | Admitting: Specialist

## 2023-10-14 ENCOUNTER — Encounter (HOSPITAL_COMMUNITY)
Admission: RE | Disposition: A | Discharge status: Home / Self Care | Payer: Self-pay | Source: Home / Self Care | Attending: Specialist

## 2023-10-14 ENCOUNTER — Ambulatory Visit (HOSPITAL_COMMUNITY): Payer: Self-pay | Admitting: Physician Assistant

## 2023-10-14 DIAGNOSIS — I4891 Unspecified atrial fibrillation: Secondary | ICD-10-CM | POA: Insufficient documentation

## 2023-10-14 DIAGNOSIS — M21162 Varus deformity, not elsewhere classified, left knee: Secondary | ICD-10-CM | POA: Diagnosis not present

## 2023-10-14 DIAGNOSIS — G8918 Other acute postprocedural pain: Secondary | ICD-10-CM | POA: Diagnosis not present

## 2023-10-14 DIAGNOSIS — Z95810 Presence of automatic (implantable) cardiac defibrillator: Secondary | ICD-10-CM | POA: Insufficient documentation

## 2023-10-14 DIAGNOSIS — I110 Hypertensive heart disease with heart failure: Secondary | ICD-10-CM | POA: Insufficient documentation

## 2023-10-14 DIAGNOSIS — I2510 Atherosclerotic heart disease of native coronary artery without angina pectoris: Secondary | ICD-10-CM

## 2023-10-14 DIAGNOSIS — Z96652 Presence of left artificial knee joint: Secondary | ICD-10-CM | POA: Diagnosis not present

## 2023-10-14 DIAGNOSIS — Z471 Aftercare following joint replacement surgery: Secondary | ICD-10-CM | POA: Diagnosis not present

## 2023-10-14 DIAGNOSIS — M1712 Unilateral primary osteoarthritis, left knee: Secondary | ICD-10-CM

## 2023-10-14 DIAGNOSIS — I509 Heart failure, unspecified: Secondary | ICD-10-CM | POA: Insufficient documentation

## 2023-10-14 DIAGNOSIS — Z885 Allergy status to narcotic agent status: Secondary | ICD-10-CM | POA: Diagnosis not present

## 2023-10-14 DIAGNOSIS — I50811 Acute right heart failure: Secondary | ICD-10-CM

## 2023-10-14 SURGERY — ARTHROPLASTY, KNEE, TOTAL
Anesthesia: Regional | Site: Knee | Laterality: Left

## 2023-10-14 MED ORDER — FENTANYL CITRATE PF 50 MCG/ML IJ SOSY
50.0000 ug | PREFILLED_SYRINGE | INTRAMUSCULAR | Status: DC
  Administered 2023-10-14: 100 ug via INTRAVENOUS
  Filled 2023-10-14: qty 2

## 2023-10-14 MED ORDER — SODIUM CHLORIDE 0.9 % IV SOLN
Freq: Once | INTRAVENOUS | Status: DC
  Filled 2023-10-14: qty 2

## 2023-10-14 MED ORDER — HYDROMORPHONE HCL 1 MG/ML IJ SOLN
INTRAMUSCULAR | Status: DC | PRN
  Administered 2023-10-14: 1 mg via INTRAVENOUS

## 2023-10-14 MED ORDER — DIPHENHYDRAMINE HCL 12.5 MG/5ML PO ELIX: 12.5000 mg | ORAL_SOLUTION | ORAL | Status: DC | PRN

## 2023-10-14 MED ORDER — SUGAMMADEX SODIUM 200 MG/2ML IV SOLN
INTRAVENOUS | Status: DC | PRN
  Administered 2023-10-14: 200 mg via INTRAVENOUS

## 2023-10-14 MED ORDER — ALUM & MAG HYDROXIDE-SIMETH 200-200-20 MG/5ML PO SUSP
30.0000 mL | ORAL | Status: DC | PRN
Start: 2023-10-14 — End: 2023-10-15

## 2023-10-14 MED ORDER — FENTANYL CITRATE (PF) 100 MCG/2ML IJ SOLN
INTRAMUSCULAR | Status: DC | PRN
  Administered 2023-10-14: 50 ug via INTRAVENOUS
  Administered 2023-10-14: 100 ug via INTRAVENOUS

## 2023-10-14 MED ORDER — METOCLOPRAMIDE HCL 5 MG/ML IJ SOLN: 5.0000 mg | Freq: Three times a day (TID) | INTRAMUSCULAR | Status: DC | PRN

## 2023-10-14 MED ORDER — LIDOCAINE HCL (CARDIAC) PF 100 MG/5ML IV SOSY
PREFILLED_SYRINGE | INTRAVENOUS | Status: DC | PRN
  Administered 2023-10-14: 80 mg via INTRAVENOUS

## 2023-10-14 MED ORDER — DOCUSATE SODIUM 100 MG PO CAPS
100.0000 mg | ORAL_CAPSULE | Freq: Two times a day (BID) | ORAL | Status: DC
  Administered 2023-10-14 – 2023-10-15 (×2): 100 mg via ORAL
  Filled 2023-10-14 (×2): qty 1

## 2023-10-14 MED ORDER — STERILE WATER FOR IRRIGATION IR SOLN
Status: DC | PRN
Start: 2023-10-14 — End: 2023-10-14
  Administered 2023-10-14: 2000 mL

## 2023-10-14 MED ORDER — METHOCARBAMOL 1000 MG/10ML IJ SOLN: 500.0000 mg | Freq: Four times a day (QID) | INTRAMUSCULAR | Status: DC | PRN

## 2023-10-14 MED ORDER — ONDANSETRON HCL 4 MG/2ML IJ SOLN: 4.0000 mg | Freq: Four times a day (QID) | INTRAMUSCULAR | Status: DC | PRN

## 2023-10-14 MED ORDER — GENTAMICIN IN SALINE 1.6-0.9 MG/ML-% IV SOLN: 80.0000 mg | INTRAVENOUS | Status: DC

## 2023-10-14 MED ORDER — VANCOMYCIN HCL IN DEXTROSE 1-5 GM/200ML-% IV SOLN
1000.0000 mg | Freq: Two times a day (BID) | INTRAVENOUS | Status: AC
  Administered 2023-10-14: 1000 mg via INTRAVENOUS
  Filled 2023-10-14: qty 200

## 2023-10-14 MED ORDER — CEFAZOLIN SODIUM-DEXTROSE 1-4 GM/50ML-% IV SOLN
1.0000 g | Freq: Four times a day (QID) | INTRAVENOUS | Status: AC
  Administered 2023-10-14 (×2): 1 g via INTRAVENOUS
  Filled 2023-10-14 (×3): qty 50

## 2023-10-14 MED ORDER — PHENYLEPHRINE 80 MCG/ML (10ML) SYRINGE FOR IV PUSH (FOR BLOOD PRESSURE SUPPORT)
PREFILLED_SYRINGE | INTRAVENOUS | Status: DC | PRN
  Administered 2023-10-14: 160 ug via INTRAVENOUS
  Administered 2023-10-14 (×2): 80 ug via INTRAVENOUS
  Administered 2023-10-14: 160 ug via INTRAVENOUS
  Administered 2023-10-14: 80 ug via INTRAVENOUS

## 2023-10-14 MED ORDER — PRAVASTATIN SODIUM 20 MG PO TABS
40.0000 mg | ORAL_TABLET | Freq: Every day | ORAL | Status: DC
  Administered 2023-10-14: 40 mg via ORAL
  Filled 2023-10-14: qty 2

## 2023-10-14 MED ORDER — MENTHOL 3 MG MT LOZG: 1.0000 | LOZENGE | OROMUCOSAL | Status: DC | PRN

## 2023-10-14 MED ORDER — ONDANSETRON HCL 4 MG/2ML IJ SOLN
INTRAMUSCULAR | Status: DC | PRN
  Administered 2023-10-14: 4 mg via INTRAVENOUS

## 2023-10-14 MED ORDER — LACTATED RINGERS IV SOLN: INTRAVENOUS | Status: DC

## 2023-10-14 MED ORDER — SOTALOL HCL 120 MG PO TABS
120.0000 mg | ORAL_TABLET | Freq: Two times a day (BID) | ORAL | Status: DC
  Administered 2023-10-14 – 2023-10-15 (×2): 120 mg via ORAL
  Filled 2023-10-14 (×2): qty 1

## 2023-10-14 MED ORDER — METOCLOPRAMIDE HCL 5 MG PO TABS: 5.0000 mg | ORAL_TABLET | Freq: Three times a day (TID) | ORAL | Status: DC | PRN

## 2023-10-14 MED ORDER — CHLORHEXIDINE GLUCONATE 0.12 % MT SOLN
15.0000 mL | Freq: Once | OROMUCOSAL | Status: AC
  Administered 2023-10-14: 15 mL via OROMUCOSAL

## 2023-10-14 MED ORDER — VANCOMYCIN HCL IN DEXTROSE 1-5 GM/200ML-% IV SOLN
1000.0000 mg | INTRAVENOUS | Status: AC
  Administered 2023-10-14: 1000 mg via INTRAVENOUS
  Filled 2023-10-14: qty 200

## 2023-10-14 MED ORDER — ACETAMINOPHEN 10 MG/ML IV SOLN
1000.0000 mg | INTRAVENOUS | Status: AC
  Administered 2023-10-14: 1000 mg via INTRAVENOUS
  Filled 2023-10-14: qty 100

## 2023-10-14 MED ORDER — POLYETHYLENE GLYCOL 3350 17 G PO PACK: 17.0000 g | PACK | Freq: Every day | ORAL | Status: DC | PRN

## 2023-10-14 MED ORDER — GENTAMICIN IN SALINE 1.6-0.9 MG/ML-% IV SOLN
80.0000 mg | INTRAVENOUS | Status: DC
  Filled 2023-10-14: qty 50

## 2023-10-14 MED ORDER — RISAQUAD PO CAPS
1.0000 | ORAL_CAPSULE | Freq: Every day | ORAL | Status: DC
  Administered 2023-10-15: 1 via ORAL
  Filled 2023-10-14: qty 1

## 2023-10-14 MED ORDER — BUPIVACAINE-EPINEPHRINE (PF) 0.25% -1:200000 IJ SOLN
INTRAMUSCULAR | Status: AC
  Filled 2023-10-14: qty 30

## 2023-10-14 MED ORDER — LIDOCAINE 2% (20 MG/ML) 5 ML SYRINGE: INTRAMUSCULAR | Status: DC | PRN

## 2023-10-14 MED ORDER — HYDROMORPHONE HCL 2 MG/ML IJ SOLN
INTRAMUSCULAR | Status: AC
  Filled 2023-10-14: qty 1

## 2023-10-14 MED ORDER — TRAZODONE HCL 50 MG PO TABS
50.0000 mg | ORAL_TABLET | Freq: Every evening | ORAL | Status: DC | PRN
  Administered 2023-10-15: 50 mg via ORAL
  Filled 2023-10-14: qty 1

## 2023-10-14 MED ORDER — MAGNESIUM CITRATE PO SOLN: 1.0000 | Freq: Once | ORAL | Status: DC | PRN

## 2023-10-14 MED ORDER — PROPOFOL 10 MG/ML IV BOLUS
INTRAVENOUS | Status: DC | PRN
  Administered 2023-10-14: 100 mg via INTRAVENOUS

## 2023-10-14 MED ORDER — FUROSEMIDE 40 MG PO TABS
40.0000 mg | ORAL_TABLET | Freq: Every day | ORAL | Status: DC
  Filled 2023-10-14: qty 1

## 2023-10-14 MED ORDER — DEXAMETHASONE SODIUM PHOSPHATE 10 MG/ML IJ SOLN
INTRAMUSCULAR | Status: DC | PRN
Start: 2023-10-14 — End: 2023-10-14
  Administered 2023-10-14: 8 mg via INTRAVENOUS

## 2023-10-14 MED ORDER — BUPIVACAINE-EPINEPHRINE 0.25% -1:200000 IJ SOLN
INTRAMUSCULAR | Status: DC | PRN
  Administered 2023-10-14: 30 mL

## 2023-10-14 MED ORDER — BUPIVACAINE LIPOSOME 1.3 % IJ SUSP
INTRAMUSCULAR | Status: DC | PRN
Start: 2023-10-14 — End: 2023-10-14
  Administered 2023-10-14: 20 mL

## 2023-10-14 MED ORDER — CITALOPRAM HYDROBROMIDE 20 MG PO TABS
40.0000 mg | ORAL_TABLET | Freq: Every day | ORAL | Status: DC
  Administered 2023-10-15: 40 mg via ORAL
  Filled 2023-10-14: qty 2

## 2023-10-14 MED ORDER — ONDANSETRON HCL 4 MG PO TABS: 4.0000 mg | ORAL_TABLET | Freq: Four times a day (QID) | ORAL | Status: DC | PRN

## 2023-10-14 MED ORDER — ACETAMINOPHEN 500 MG PO TABS
1000.0000 mg | ORAL_TABLET | Freq: Four times a day (QID) | ORAL | Status: DC
  Administered 2023-10-14 – 2023-10-15 (×3): 1000 mg via ORAL
  Filled 2023-10-14 (×3): qty 2

## 2023-10-14 MED ORDER — KCL IN DEXTROSE-NACL 20-5-0.9 MEQ/L-%-% IV SOLN
INTRAVENOUS | Status: DC
  Filled 2023-10-14: qty 1000

## 2023-10-14 MED ORDER — HYDROMORPHONE HCL 2 MG PO TABS
2.0000 mg | ORAL_TABLET | ORAL | Status: DC | PRN
  Administered 2023-10-14 – 2023-10-15 (×5): 2 mg via ORAL
  Filled 2023-10-14 (×5): qty 1

## 2023-10-14 MED ORDER — SODIUM CHLORIDE 0.9 % IR SOLN
Status: DC | PRN
  Administered 2023-10-14: 1500 mL

## 2023-10-14 MED ORDER — 0.9 % SODIUM CHLORIDE (POUR BTL) OPTIME
TOPICAL | Status: DC | PRN
  Administered 2023-10-14: 1000 mL

## 2023-10-14 MED ORDER — GENTAMICIN IN SALINE 0.8 MG/ML IV SOLN: 80.0000 mg | Freq: Three times a day (TID) | INTRAVENOUS | Status: DC

## 2023-10-14 MED ORDER — ACETAMINOPHEN 500 MG PO TABS: 1000.0000 mg | ORAL_TABLET | Freq: Once | ORAL | Status: DC

## 2023-10-14 MED ORDER — AMISULPRIDE (ANTIEMETIC) 5 MG/2ML IV SOLN: 10.0000 mg | Freq: Once | INTRAVENOUS | Status: DC | PRN

## 2023-10-14 MED ORDER — PHENOL 1.4 % MT LIQD: 1.0000 | OROMUCOSAL | Status: DC | PRN

## 2023-10-14 MED ORDER — MIDAZOLAM HCL 2 MG/2ML IJ SOLN: 1.0000 mg | INTRAMUSCULAR | Status: DC

## 2023-10-14 MED ORDER — SODIUM CHLORIDE 0.9% FLUSH
INTRAVENOUS | Status: DC | PRN
  Administered 2023-10-14: 40 mL

## 2023-10-14 MED ORDER — HYDROMORPHONE HCL 1 MG/ML IJ SOLN: 0.2500 mg | INTRAMUSCULAR | Status: DC | PRN

## 2023-10-14 MED ORDER — TRANEXAMIC ACID-NACL 1000-0.7 MG/100ML-% IV SOLN
1000.0000 mg | INTRAVENOUS | Status: AC
  Administered 2023-10-14: 1000 mg via INTRAVENOUS
  Filled 2023-10-14: qty 100

## 2023-10-14 MED ORDER — SODIUM CHLORIDE (PF) 0.9 % IJ SOLN
INTRAMUSCULAR | Status: AC
Start: 2023-10-14 — End: ?
  Filled 2023-10-14: qty 40

## 2023-10-14 MED ORDER — BUPIVACAINE LIPOSOME 1.3 % IJ SUSP
INTRAMUSCULAR | Status: AC
  Filled 2023-10-14: qty 20

## 2023-10-14 MED ORDER — BISACODYL 5 MG PO TBEC: 5.0000 mg | DELAYED_RELEASE_TABLET | Freq: Every day | ORAL | Status: DC | PRN

## 2023-10-14 MED ORDER — FENTANYL CITRATE PF 50 MCG/ML IJ SOSY: 25.0000 ug | PREFILLED_SYRINGE | INTRAMUSCULAR | Status: DC | PRN

## 2023-10-14 MED ORDER — ROCURONIUM BROMIDE 10 MG/ML (PF) SYRINGE
PREFILLED_SYRINGE | INTRAVENOUS | Status: AC
  Filled 2023-10-14: qty 10

## 2023-10-14 MED ORDER — METHOCARBAMOL 500 MG PO TABS
500.0000 mg | ORAL_TABLET | Freq: Four times a day (QID) | ORAL | Status: DC | PRN
  Administered 2023-10-14 – 2023-10-15 (×3): 500 mg via ORAL
  Filled 2023-10-14 (×3): qty 1

## 2023-10-14 MED ORDER — PHENYLEPHRINE HCL-NACL 20-0.9 MG/250ML-% IV SOLN
INTRAVENOUS | Status: DC | PRN
Start: 2023-10-14 — End: 2023-10-14
  Administered 2023-10-14: 40 ug/min via INTRAVENOUS

## 2023-10-14 MED ORDER — ORAL CARE MOUTH RINSE: 15.0000 mL | Freq: Once | OROMUCOSAL | Status: AC

## 2023-10-14 MED ORDER — NITROGLYCERIN 0.4 MG SL SUBL: 0.4000 mg | SUBLINGUAL_TABLET | SUBLINGUAL | Status: DC | PRN

## 2023-10-14 MED ORDER — ROCURONIUM BROMIDE 10 MG/ML (PF) SYRINGE
PREFILLED_SYRINGE | INTRAVENOUS | Status: DC | PRN
  Administered 2023-10-14: 70 mg via INTRAVENOUS

## 2023-10-14 MED ORDER — CEFAZOLIN SODIUM-DEXTROSE 2-4 GM/100ML-% IV SOLN
2.0000 g | INTRAVENOUS | Status: AC
  Administered 2023-10-14: 2 g via INTRAVENOUS
  Filled 2023-10-14: qty 100

## 2023-10-14 MED ORDER — FENTANYL CITRATE (PF) 250 MCG/5ML IJ SOLN
INTRAMUSCULAR | Status: AC
  Filled 2023-10-14: qty 5

## 2023-10-14 MED ORDER — RIVAROXABAN 10 MG PO TABS
10.0000 mg | ORAL_TABLET | Freq: Every day | ORAL | Status: DC
  Administered 2023-10-15: 10 mg via ORAL
  Filled 2023-10-14: qty 1

## 2023-10-14 MED ORDER — PHENYLEPHRINE 80 MCG/ML (10ML) SYRINGE FOR IV PUSH (FOR BLOOD PRESSURE SUPPORT)
PREFILLED_SYRINGE | INTRAVENOUS | Status: AC
  Filled 2023-10-14: qty 10

## 2023-10-14 MED ORDER — HYDROMORPHONE HCL 1 MG/ML IJ SOLN: 0.5000 mg | INTRAMUSCULAR | Status: DC | PRN

## 2023-10-14 MED ORDER — GENTAMICIN SULFATE 40 MG/ML IJ SOLN: 5.0000 mg/kg | INTRAVENOUS | Status: DC

## 2023-10-14 MED ORDER — SACUBITRIL-VALSARTAN 24-26 MG PO TABS
1.0000 | ORAL_TABLET | Freq: Every day | ORAL | Status: DC
  Administered 2023-10-14 – 2023-10-15 (×2): 1 via ORAL
  Filled 2023-10-14 (×2): qty 1

## 2023-10-14 SURGICAL SUPPLY — 59 items
ATTUNE PS FEM LT SZ 7 CEM KNEE (Femur) IMPLANT
ATTUNE PSRP INSR SZ7 6 KNEE (Insert) IMPLANT
BAG COUNTER SPONGE SURGICOUNT (BAG) IMPLANT
BAG ZIPLOCK 12X15 (MISCELLANEOUS) IMPLANT
BASE TIBIAL ROT PLAT SZ 7 KNEE (Knees) IMPLANT
BLADE SAW SGTL 11.0X1.19X90.0M (BLADE) ×1 IMPLANT
BLADE SAW SGTL 13.0X1.19X90.0M (BLADE) ×1 IMPLANT
BLADE SURG SZ10 CARB STEEL (BLADE) ×2 IMPLANT
BNDG ELASTIC 4INX 5YD STR LF (GAUZE/BANDAGES/DRESSINGS) ×1 IMPLANT
BNDG ELASTIC 6INX 5YD STR LF (GAUZE/BANDAGES/DRESSINGS) ×1 IMPLANT
BOWL SMART MIX CTS (DISPOSABLE) ×1 IMPLANT
CEMENT HV SMART SET (Cement) ×2 IMPLANT
CLSR STERI-STRIP ANTIMIC 1/2X4 (GAUZE/BANDAGES/DRESSINGS) IMPLANT
COVER SURGICAL LIGHT HANDLE (MISCELLANEOUS) ×1 IMPLANT
CUFF TRNQT CYL 34X4.125X (TOURNIQUET CUFF) ×1 IMPLANT
DRAPE INCISE IOBAN 66X45 STRL (DRAPES) IMPLANT
DRAPE SHEET LG 3/4 BI-LAMINATE (DRAPES) ×1 IMPLANT
DRAPE SURG ORHT 6 SPLT 77X108 (DRAPES) ×2 IMPLANT
DRAPE TOP 10253 STERILE (DRAPES) ×1 IMPLANT
DRAPE U-SHAPE 47X51 STRL (DRAPES) ×1 IMPLANT
DRSG AQUACEL AG ADV 3.5X10 (GAUZE/BANDAGES/DRESSINGS) ×1 IMPLANT
DURAPREP 26ML APPLICATOR (WOUND CARE) ×1 IMPLANT
ELECT BLADE TIP CTD 4 INCH (ELECTRODE) ×1 IMPLANT
ELECT PENCIL ROCKER SW 15FT (MISCELLANEOUS) ×1 IMPLANT
ELECT REM PT RETURN 15FT ADLT (MISCELLANEOUS) ×1 IMPLANT
EVACUATOR 1/8 PVC DRAIN (DRAIN) IMPLANT
GLOVE BIO SURGEON STRL SZ7 (GLOVE) ×1 IMPLANT
GLOVE BIOGEL PI IND STRL 7.0 (GLOVE) ×1 IMPLANT
GLOVE BIOGEL PI IND STRL 8 (GLOVE) ×1 IMPLANT
GLOVE SURG SS PI 8.0 STRL IVOR (GLOVE) ×2 IMPLANT
GOWN STRL REUS W/ TWL XL LVL3 (GOWN DISPOSABLE) ×2 IMPLANT
HEMOSTAT SPONGE AVITENE ULTRA (HEMOSTASIS) IMPLANT
HOLDER FOLEY CATH W/STRAP (MISCELLANEOUS) ×1 IMPLANT
IMMOBILIZER KNEE 20 (SOFTGOODS) ×1 IMPLANT
IMMOBILIZER KNEE 20 THIGH 36 (SOFTGOODS) ×1 IMPLANT
KIT TURNOVER KIT A (KITS) IMPLANT
MANIFOLD NEPTUNE II (INSTRUMENTS) ×1 IMPLANT
NS IRRIG 1000ML POUR BTL (IV SOLUTION) IMPLANT
PACK TOTAL KNEE CUSTOM (KITS) ×1 IMPLANT
PATELLA MEDIAL ATTUN 35MM KNEE (Knees) IMPLANT
PIN STEINMAN FIXATION KNEE (PIN) IMPLANT
PROTECTOR NERVE ULNAR (MISCELLANEOUS) ×1 IMPLANT
SAW OSC TIP CART 19.5X105X1.3 (SAW) IMPLANT
SEALER BIPOLAR AQUA 6.0 (INSTRUMENTS) IMPLANT
SET HNDPC FAN SPRY TIP SCT (DISPOSABLE) ×1 IMPLANT
SOLUTION PRONTOSAN WOUND 350ML (IRRIGATION / IRRIGATOR) ×1 IMPLANT
SPIKE FLUID TRANSFER (MISCELLANEOUS) ×1 IMPLANT
STAPLER VISISTAT (STAPLE) IMPLANT
STRIP CLOSURE SKIN 1/2X4 (GAUZE/BANDAGES/DRESSINGS) IMPLANT
SUT BONE WAX W31G (SUTURE) ×1 IMPLANT
SUT MNCRL AB 4-0 PS2 18 (SUTURE) IMPLANT
SUT VIC AB 1 CT1 27XBRD ANTBC (SUTURE) ×3 IMPLANT
SUT VIC AB 2-0 CT1 TAPERPNT 27 (SUTURE) ×3 IMPLANT
SUTURE STRATFX 0 PDS 27 VIOLET (SUTURE) ×1 IMPLANT
TRAY FOLEY MTR SLVR 16FR STAT (SET/KITS/TRAYS/PACK) ×1 IMPLANT
TUBE SUCTION HIGH CAP CLEAR NV (SUCTIONS) ×1 IMPLANT
WATER STERILE IRR 1000ML POUR (IV SOLUTION) ×1 IMPLANT
WIPE CHG 2% PREP (PERSONAL CARE ITEMS) ×1 IMPLANT
WRAP KNEE MAXI GEL POST OP (GAUZE/BANDAGES/DRESSINGS) ×1 IMPLANT

## 2023-10-14 NOTE — Plan of Care (Signed)
  Problem: Activity: Goal: Risk for activity intolerance will decrease Outcome: Progressing   Problem: Nutrition: Goal: Adequate nutrition will be maintained Outcome: Progressing   Problem: Elimination: Goal: Will not experience complications related to bowel motility Outcome: Progressing   Problem: Pain Managment: Goal: General experience of comfort will improve and/or be controlled Outcome: Progressing   Problem: Safety: Goal: Ability to remain free from injury will improve Outcome: Progressing   Problem: Education: Goal: Knowledge of the prescribed therapeutic regimen will improve Outcome: Progressing   Problem: Activity: Goal: Ability to avoid complications of mobility impairment will improve Outcome: Progressing   Problem: Pain Management: Goal: Pain level will decrease with appropriate interventions Outcome: Progressing   Problem: Education: Goal: Knowledge of the prescribed therapeutic regimen will improve Outcome: Progressing   Problem: Activity: Goal: Ability to avoid complications of mobility impairment will improve Outcome: Progressing   Problem: Pain Management: Goal: Pain level will decrease with appropriate interventions Outcome: Progressing

## 2023-10-14 NOTE — Anesthesia Procedure Notes (Addendum)
 Procedure Name: Intubation Date/Time: 10/14/2023 12:00 PM  Performed by: Erleen Hawking, CRNAPre-anesthesia Checklist: Patient identified, Emergency Drugs available, Suction available, Patient being monitored and Timeout performed Patient Re-evaluated:Patient Re-evaluated prior to induction Oxygen Delivery Method: Circle system utilized Preoxygenation: Pre-oxygenation with 100% oxygen Induction Type: IV induction Ventilation: Mask ventilation without difficulty and Oral airway inserted - appropriate to patient size Laryngoscope Size: Mac and 4 Grade View: Grade II Tube type: Oral Tube size: 7.5 mm Number of attempts: 1 Airway Equipment and Method: Stylet Placement Confirmation: ETT inserted through vocal cords under direct vision, positive ETCO2, CO2 detector and breath sounds checked- equal and bilateral Secured at: 22 cm Tube secured with: Tape Dental Injury: Teeth and Oropharynx as per pre-operative assessment  Comments: ATOI, short TM distance. Recommend glidescope.

## 2023-10-14 NOTE — Transfer of Care (Signed)
 Immediate Anesthesia Transfer of Care Note  Patient: Walter Raymond  Procedure(s) Performed: Procedure(s): ARTHROPLASTY, KNEE, TOTAL (Left)  Patient Location: PACU  Anesthesia Type:General  Level of Consciousness:  sedated, patient cooperative and responds to stimulation  Airway & Oxygen Therapy:Patient Spontanous Breathing and Patient connected to face mask oxgen  Post-op Assessment:  Report given to PACU RN and Post -op Vital signs reviewed and stable  Post vital signs:  Reviewed and stable  Last Vitals:  Vitals:   10/14/23 0903 10/14/23 1358  BP: 131/77 135/77  Pulse: 65 60  Resp: 18 10  Temp: 36.5 C   SpO2: 99% 100%    Complications: No apparent anesthesia complications

## 2023-10-14 NOTE — Care Plan (Signed)
 Ortho Bundle Case Management Note  Patient Details  Name: Walter Raymond MRN: [id number] Date of Birth: [birth year]  L TKA on 10/14/23  DCP: Home with wife DME: RW ordered through [employer]. Has cane.  PT: Lucrecia Sables                  DME Arranged:  Walker rolling DME Agency:  [employer]  HH Arranged:    HH Agency:     Additional Comments: Please contact me with any questions of if this plan should need to change.  Bronwen Canon, CCM  [facility] [phone]   10/14/2023, 10:01 AM

## 2023-10-14 NOTE — Brief Op Note (Signed)
 10/14/2023  10:47 AM  PATIENT:  Nathaneil Bakes Wojcicki  78 y.o. male  PRE-OPERATIVE DIAGNOSIS:  Left knee osteoarthritis  POST-OPERATIVE DIAGNOSIS:  * No post-op diagnosis entered *  PROCEDURE:  Procedure(s): ARTHROPLASTY, KNEE, TOTAL (Left) The aquamantis was utilized for this case to help facilitate better hemostasis as patient was felt to be at increased risk of bleeding because of recent anticoagulation use.  SURGEON:  Surgeons and Role:    * Orvan Blanch, MD - Primary  PHYSICIAN ASSISTANT:   ASSISTANTS: Bissell   ANESTHESIA:   spinal  EBL:  50   BLOOD ADMINISTERED:none  DRAINS: none   LOCAL MEDICATIONS USED:  MARCAINE     SPECIMEN:  No Specimen  DISPOSITION OF SPECIMEN:  N/A  COUNTS:  YES  TOURNIQUET:  56 min  DICTATION: .[employer] Gear Dictation and Other Dictation: Dictation Number [id number]  PLAN OF CARE: Admit for overnight observation  PATIENT DISPOSITION:  PACU - hemodynamically stable.   Delay start of Pharmacological VTE agent (>24hrs) due to surgical blood loss or risk of bleeding: no

## 2023-10-14 NOTE — Discharge Instructions (Addendum)

## 2023-10-14 NOTE — Interval H&P Note (Signed)
 History and Physical Interval Note:  10/14/2023 10:46 AM  Walter Raymond  has presented today for surgery, with the diagnosis of Left knee osteoarthritis.  The various methods of treatment have been discussed with the patient and family. After consideration of risks, benefits and other options for treatment, the patient has consented to  Procedure(s): ARTHROPLASTY, KNEE, TOTAL (Left) as a surgical intervention.  The patient's history has been reviewed, patient examined, no change in status, stable for surgery.  I have reviewed the patient's chart and labs.  Questions were answered to the patient's satisfaction.     Loel Ring

## 2023-10-14 NOTE — Anesthesia Procedure Notes (Signed)
 Arterial Line Insertion Start/End6/08/2023 11:00 AM, 10/14/2023 11:05 AM Performed by: Grace Laura, MD, anesthesiologist  Patient location: OR. Preanesthetic checklist: patient identified, IV checked, risks and benefits discussed, surgical consent, monitors and equipment checked, pre-op evaluation and timeout performed Right, radial was placed Catheter size: 20 G Hand hygiene performed  Allen's test indicative of satisfactory collateral circulation Attempts: 1 Procedure performed without using ultrasound guided technique. Following insertion, dressing applied and Biopatch. Post procedure assessment: normal and unchanged  Patient tolerated the procedure well with no immediate complications.

## 2023-10-14 NOTE — Plan of Care (Signed)
   Problem: Education: Goal: Knowledge of General Education information will improve Description Including pain rating scale, medication(s)/side effects and non-pharmacologic comfort measures Outcome: Progressing

## 2023-10-15 ENCOUNTER — Encounter (HOSPITAL_COMMUNITY): Payer: Self-pay | Admitting: Specialist

## 2023-10-15 DIAGNOSIS — Z885 Allergy status to narcotic agent status: Secondary | ICD-10-CM | POA: Diagnosis not present

## 2023-10-15 DIAGNOSIS — M25562 Pain in left knee: Secondary | ICD-10-CM | POA: Diagnosis not present

## 2023-10-15 DIAGNOSIS — I509 Heart failure, unspecified: Secondary | ICD-10-CM | POA: Diagnosis not present

## 2023-10-15 DIAGNOSIS — M1712 Unilateral primary osteoarthritis, left knee: Secondary | ICD-10-CM | POA: Diagnosis not present

## 2023-10-15 DIAGNOSIS — I4891 Unspecified atrial fibrillation: Secondary | ICD-10-CM | POA: Diagnosis not present

## 2023-10-15 DIAGNOSIS — Z95810 Presence of automatic (implantable) cardiac defibrillator: Secondary | ICD-10-CM | POA: Diagnosis not present

## 2023-10-15 DIAGNOSIS — M21162 Varus deformity, not elsewhere classified, left knee: Secondary | ICD-10-CM | POA: Diagnosis not present

## 2023-10-15 DIAGNOSIS — I2510 Atherosclerotic heart disease of native coronary artery without angina pectoris: Secondary | ICD-10-CM | POA: Diagnosis not present

## 2023-10-15 DIAGNOSIS — I110 Hypertensive heart disease with heart failure: Secondary | ICD-10-CM | POA: Diagnosis not present

## 2023-10-15 LAB — BASIC METABOLIC PANEL WITH GFR
Anion gap: 8 (ref 5–15)
BUN: 17 mg/dL (ref 8–23)
CO2: 25 mmol/L (ref 22–32)
Calcium: 8.6 mg/dL — ABNORMAL LOW (ref 8.9–10.3)
Chloride: 100 mmol/L (ref 98–111)
Creatinine, Ser: 1.08 mg/dL (ref 0.61–1.24)
GFR, Estimated: >60 mL/min (ref >60)
Glucose, Bld: 152 mg/dL — ABNORMAL HIGH (ref 70–99)
Potassium: 4.6 mmol/L (ref 3.5–5.1)
Sodium: 133 mmol/L — ABNORMAL LOW (ref 135–145)

## 2023-10-15 LAB — CBC
HCT: 42 % (ref 39.0–52.0)
Hemoglobin: 13.7 g/dL (ref 13.0–17.0)
MCH: 31.7 pg (ref 26.0–34.0)
MCHC: 32.6 g/dL (ref 30.0–36.0)
MCV: 97.2 fL (ref 80.0–100.0)
Platelets: 187 10*3/uL (ref 150–400)
RBC: 4.32 MIL/uL (ref 4.22–5.81)
RDW: 13.3 % (ref 11.5–15.5)
WBC: 12.6 10*3/uL — ABNORMAL HIGH (ref 4.0–10.5)
nRBC: 0 % (ref 0.0–0.2)

## 2023-10-15 MED ORDER — POLYETHYLENE GLYCOL 3350 17 G PO PACK: 17.0000 g | PACK | Freq: Every day | ORAL | 0 refills | Status: AC

## 2023-10-15 MED ORDER — HYDROMORPHONE HCL 2 MG PO TABS: 2.0000 mg | ORAL_TABLET | ORAL | 0 refills | Status: AC | PRN

## 2023-10-15 MED ORDER — DOCUSATE SODIUM 100 MG PO CAPS: 100.0000 mg | ORAL_CAPSULE | Freq: Two times a day (BID) | ORAL | 2 refills | Status: AC

## 2023-10-15 MED ORDER — METHOCARBAMOL 500 MG PO TABS: 500.0000 mg | ORAL_TABLET | Freq: Three times a day (TID) | ORAL | 1 refills | Status: AC | PRN

## 2023-10-15 NOTE — Anesthesia Postprocedure Evaluation (Deleted)
 Anesthesia Post Note  Patient: Walter Raymond  Procedure(s) Performed: ARTHROPLASTY, KNEE, TOTAL (Left: Knee)     Patient location during evaluation: PACU Anesthesia Type: Regional Level of consciousness: awake and alert Pain management: pain level controlled Vital Signs Assessment: post-procedure vital signs reviewed and stable Respiratory status: spontaneous breathing, nonlabored ventilation, respiratory function stable and patient connected to nasal cannula oxygen Cardiovascular status: blood pressure returned to baseline and stable Postop Assessment: no apparent nausea or vomiting  Encounter Notable Events  Notable Event Outcome Phase Comment  Difficult to intubate - expected  Intraprocedure Filed from anesthesia note documentation.    Last Vitals:  Vitals:   10/15/23 0533 10/15/23 1001  BP: 102/66 (!) 106/59  Pulse: (!) 59 68  Resp: 18 16  Temp: (!) 36.4 C 36.8 C  SpO2: 99% 100%    Last Pain:  Vitals:   10/15/23 1034  TempSrc:   PainSc: 5                  [name] L [place]

## 2023-10-15 NOTE — TOC Transition Note (Signed)
 Transition of Care [facility] - Discharge Note   Patient Details  Name: Walter Raymond MRN: [id number] Date of Birth: [birth year]  [facility] of [facility] CM/SW Contact:  Delilah Fend, LCSW Phone Number: 10/15/2023, 9:20 AM   Clinical Narrative:     Met with pt and confirming he has received RW to room via [employer].  OPPT already arranged with [facility] ([place]).  No further TOC needs.  Final next level of care: OP Rehab Barriers to Discharge: No Barriers Identified   Patient Goals and CMS Choice Patient states their goals for this hospitalization and ongoing recovery are:: return home          Discharge Placement                       Discharge Plan and Services Additional resources added to the After Visit Summary for                  DME Arranged: Walker rolling DME Agency: [employer]                  Social Drivers of Health (SDOH) Interventions SDOH Screenings   Food Insecurity: No Food Insecurity (10/14/2023)  Housing: Low Risk  (10/14/2023)  Transportation Needs: No Transportation Needs (10/14/2023)  Utilities: Not At Risk (10/14/2023)  Financial Resource Strain: Low Risk  (08/09/2023)   Received from [facility]  Physical Activity: Unknown (08/09/2023)   Received from [facility]  Social Connections: Unknown (10/14/2023)  Stress: Stress Concern Present (08/09/2023)   Received from [facility]  Tobacco Use: Low Risk  (10/14/2023)     Readmission Risk Interventions     No data to display

## 2023-10-15 NOTE — Anesthesia Postprocedure Evaluation (Signed)
 Anesthesia Post Note  Patient: Walter Raymond  Procedure(s) Performed: ARTHROPLASTY, KNEE, TOTAL (Left: Knee)     Patient location during evaluation: PACU Anesthesia Type: Regional and General Level of consciousness: awake and alert Pain management: pain level controlled Vital Signs Assessment: post-procedure vital signs reviewed and stable Respiratory status: spontaneous breathing, nonlabored ventilation, respiratory function stable and patient connected to nasal cannula oxygen Cardiovascular status: blood pressure returned to baseline and stable Postop Assessment: no apparent nausea or vomiting Anesthetic complications: no  Encounter Notable Events  Notable Event Outcome Phase Comment  Difficult to intubate - expected  Intraprocedure Filed from anesthesia note documentation.    Last Vitals:  Vitals:   10/15/23 0533 10/15/23 1001  BP: 102/66 (!) 106/59  Pulse: (!) 59 68  Resp: 18 16  Temp: (!) 36.4 C 36.8 C  SpO2: 99% 100%    Last Pain:  Vitals:   10/15/23 1034  TempSrc:   PainSc: 5                  Walter Raymond

## 2023-10-15 NOTE — Progress Notes (Signed)
 Physical Therapy Treatment Patient Details Name: Walter Raymond MRN: [id number] DOB: [birth year] Today's Date: 10/15/2023   History of Present Illness 78 yo male presents to therapy s/p L TKA on 10/14/2023 due to failure of conservative measures. Pt PMH includes but is not limited to: A-fib, cardioverter/defibrillator in situ, CHF, CAD s/p cath, GERD, HTN, HLD, and PTSD.    PT Comments  Pt motivated and progressing well with mobility including decreased assist for all tasks, improvement in stability and increased activity tolerance.  Pt up to ambulate increased distance in hall, negotiated stairs and reviewed written HEP.  Pt eager for dc home this date.    If plan is discharge home, recommend the following: A little help with walking and/or transfers;A little help with bathing/dressing/bathroom;Assistance with cooking/housework;Assist for transportation;Help with stairs or ramp for entrance   Can travel by private vehicle        Equipment Recommendations  Rolling walker (2 wheels)    Recommendations for Other Services       Precautions / Restrictions Precautions Precautions: Fall;Knee Recall of Precautions/Restrictions: Intact Restrictions Weight Bearing Restrictions Per Provider Order: Yes LLE Weight Bearing Per Provider Order: Weight bearing as tolerated     Mobility  Bed Mobility Overal bed mobility: Needs Assistance Bed Mobility: Sit to Supine     Supine to sit: Contact guard Sit to supine: Supervision   General bed mobility comments: for safety    Transfers Overall transfer level: Needs assistance Equipment used: Rolling walker (2 wheels) Transfers: Sit to/from Stand Sit to Stand: Contact guard assist, Supervision           General transfer comment: cues for LE management and use of UEs to self assist    Ambulation/Gait Ambulation/Gait assistance: Contact guard assist, Supervision Gait Distance (Feet): 100 Feet (and additional 50') Assistive device:  Rolling walker (2 wheels) Gait Pattern/deviations: Step-to pattern, Decreased step length - right, Decreased step length - left, Shuffle, Trunk flexed Gait velocity: decr     General Gait Details: min cues for sequence, posture and position from RW; noted improvement in stability`   Stairs Stairs: Yes Stairs assistance: Min assist Stair Management: One rail Right, Step to pattern, Forwards, With cane Number of Stairs: 5 General stair comments: cues for seqeunce and foot/cane placement   Wheelchair Mobility     Tilt Bed    Modified Rankin (Stroke Patients Only)       Balance Overall balance assessment: Needs assistance Sitting-balance support: Feet supported, No upper extremity supported Sitting balance-Leahy Scale: Good     Standing balance support: Single extremity supported Standing balance-Leahy Scale: Fair                              Hotel manager: Impaired Factors Affecting Communication: Hearing impaired  Cognition Arousal: Alert Behavior During Therapy: WFL for tasks assessed/performed   PT - Cognitive impairments: No apparent impairments                         Following commands: Intact      Cueing Cueing Techniques: Verbal cues  Exercises Total Joint Exercises Ankle Circles/Pumps: AROM, Both, 15 reps, Supine Quad Sets: AROM, Both, 10 reps, Supine Heel Slides: AAROM, Left, 15 reps, Supine Straight Leg Raises: AAROM, AROM, Left, 15 reps, Supine Long Arc Quad: AAROM, Left, 10 reps, Seated    General Comments        Pertinent Vitals/Pain Pain Assessment  Pain Assessment: 0-10 Pain Score: 4  Pain Location: L knee/thigh Pain Descriptors / Indicators: Aching, Sore Pain Intervention(s): Limited activity within patient's tolerance, Monitored during session, Premedicated before session, Ice applied    Home Living Family/patient expects to be discharged to:: Private residence Living Arrangements:  Spouse/significant other Available Help at Discharge: Family;Available 24 hours/day Type of Home: House Home Access: Stairs to enter Entrance Stairs-Rails: Right Entrance Stairs-Number of Steps: 2   Home Layout: One level Home Equipment: Cane - single point;Crutches      Prior Function            PT Goals (current goals can now be found in the care plan section) Acute Rehab PT Goals Patient Stated Goal: Regain IND PT Goal Formulation: With patient Time For Goal Achievement: 10/22/23 Potential to Achieve Goals: Good Progress towards PT goals: Progressing toward goals    Frequency    7X/week      PT Plan      Co-evaluation              AM-PAC PT "6 Clicks" Mobility   Outcome Measure  Help needed turning from your back to your side while in a flat bed without using bedrails?: A Little Help needed moving from lying on your back to sitting on the side of a flat bed without using bedrails?: A Little Help needed moving to and from a bed to a chair (including a wheelchair)?: A Little Help needed standing up from a chair using your arms (e.g., wheelchair or bedside chair)?: A Little Help needed to walk in hospital room?: A Little Help needed climbing 3-5 steps with a railing? : A Little 6 Click Score: 18    End of Session Equipment Utilized During Treatment: Gait belt Activity Tolerance: Patient tolerated treatment well Patient left: in bed;with call bell/phone within reach;with bed alarm set Nurse Communication: Mobility status PT Visit Diagnosis: Difficulty in walking, not elsewhere classified (R26.2)     Time: 6045-4098 PT Time Calculation (min) (ACUTE ONLY): 20 min  Charges:    $Gait Training: 8-22 mins $Therapeutic Exercise: 8-22 mins PT General Charges $$ ACUTE PT VISIT: 1 Visit                     Thedora Finlay PT Acute Rehabilitation Services Pager [phone] Office [phone]    Diann Bangerter 10/15/2023, 12:20 PM

## 2023-10-15 NOTE — Op Note (Signed)
 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  PROCEDURE PERFORMED:  Left total knee arthroplasty utilizing Attune rotating platform, 7 femur, 7 tibia, 6 mm insert, 35 patella.  ANESTHESIA:  General.  ASSISTANT: Amy Kansky, PA-C.  HISTORY:  The patient is a 78 year old with end-stage osteoarthrosis, medial compartment and lateral facet of the patella, indicated for replacement of the degenerated joint.  Risks and benefits discussed including bleeding, infection, damage to  neurovascular structures, no change in symptoms or worsening symptoms, DVT, PE, anesthetic complications, etc.  DESCRIPTION OF PROCEDURE:  The patient was placed in the supine position.  After induction of adequate general anesthesia, 1 g of vancomycin  and 2 g of Kefzol, the left lower extremity was prepped, draped and exsanguinated in the usual sterile fashion.   The thigh tourniquet was inflated to 225 mmHg.  A midline incision was then made over the knee.  Full-thickness flaps were developed.  Median parapatellar arthrotomy was performed.  The patella was gently everted.  The knee was flexed.  Tricompartmental  osteoarthritis was noted.  Particularly the medial compartment of the patellofemoral joint and lateral facet.  Remnants of medial and lateral menisci were excised.  The soft tissue was elevated medially preserving the MCL.  The Leksell rongeur was  utilized to the fascia and the notch above the femoral notch as a starting hole for the femoral drill.  This was drilled in line with the femur, entering the canal without difficulty.  This was irrigated utilizing a  T-handle for confirmation.   Intramedullary guide, 5-degree left with 10 off the distal femur.  This was pinned.  I performed the distal femoral cut without difficulty.  It was sized off the anterior cortex to be a 7.  This was then pinned in 3 degrees of external rotation coplanar  with the transcondylar axis.  This was pinned.  I performed anterior, posterior, and chamfer cuts.  Soft tissue was protected posteriorly at all times.  I then subluxed the tibia.  The low side was medial.  Excellent alignment guide parallel to the  shaft.  Three degrees below, bisected the tibiotalar joint, throughout the defect which was medially.  This was then pinned.  I performed a tibial cut protecting soft tissues posteriorly at all times.  Then with an extension block, we checked his gap in  full extension.  It was satisfactory and stable.  The knee was then reflexed.  The tibia was subluxed, sized maximally with a 7, just the medial third of the tibial tubercle.  This was then pinned, harvested bone centrally and impacted in the distal  femur, drilled centrally, punch guide, turned attention back to the femur, box cut, jig applied, bisected the condyles with the pin and performed a box cut.  This was then removed and placed a trial femur, which fit flush.  A 6 mm insert, reduced it.  He  had full extension, full flexion, good stability with varus and valgus stress at 0 to 30 degrees.  Negative anterior drawer.  Everted of the patella, measured at 24 plane to a 15.  Utilized the patellar jig.  This was incised to a 35.  The trial patella  was parallel to the  joint surface.  Drill holes were placed.  I then placed a trial patella, reduced it and had excellent patellofemoral tracking.  All instrumentation was then removed.  I checked posteriorly, popliteus, and the capsule was intact.  We  used pulsatile lavage to clean all bony surfaces.  The knee was then flexed.  All surfaces were thoroughly dried.  We used Exparel  through the posteromedial capsule withdrawing without a break in a vacuum and injecting 10 mL.  The medial and lateral  gutters were anesthetized as well as the periosteum of the femur, quadriceps, proximal tibia, etc.  Thoroughly dried and then cement was mixed on the back table under centrifuge and vacuum.  This was then placed in the proximal tibia, digitally  pressurizing it.  Prior to this, we had drill holes in the sclerotic bone medially.  Cement was placed in the tibial tray.  It was impacted into place and redundant cement removed.  Cemented the femur, cemented all the femoral component and the femur was  impacted into place, fit flush, placed a trial of 6, reduced it into full extension, held the axial load throughout the curing of the cement.  Marcaine with epinephrine  followed by Prontosan was then placed in the wound during the curing of the cement.   It was cemented and clamped to the patella as well.  The wound was then covered.  At 56 minutes, the tourniquet was deflated and bleeding was cauterized.  I then removed the trial insert and meticulously removed all the redundant cement.  It was  copiously irrigated with pulsatile lavage and Prontosan.  Selected 6 mm inset, reduced it and again, it had full extension, full flexion, good stability with varus and valgus stress at 0 or 30 degrees.  Negative anterior drawer.  In mid flexion, we  reapproximated patellar arthrotomy with 1-0 Vicryl interrupted figure-of-eight sutures followed by oversewing with a running Stratafix.  Again, had excellent patellofemoral tracking following this.  Irrigated the subcutaneous tissue with Prontosan,  subcuticular 2-0 on the skin with small staples.  The wound was dressed sterilely and placed in an immobilizer.  The patient was extubated and transported to the recovery room in satisfactory condition.  The patient tolerated the procedure well.  No  complications.  BLOOD LOSS:  50 mL.  Assistant, Amy Kansky, PA-C assisted throughout the case with patient positioning, retraction, and closure.    MUK D: 10/14/2023 1:39:51 pm T: 10/15/2023 1:02:00 am  JOB: [id number]/ [id number]

## 2023-10-15 NOTE — Discharge Summary (Signed)
 Physician Discharge Summary   Patient ID: Walter Raymond MRN: [id number] DOB/AGE: May 15, 1945 77 y.o.  Admit date: 10/14/2023 Discharge date: 10/15/23  Primary Diagnosis: left knee primary osteoarthritis  Admission Diagnoses:  Past Medical History:  Diagnosis Date   A-fib [facility])    AICD (automatic cardioverter/defibrillator) present    new generator    Medtronic Claria MRT CRT-D   Arthritis    CHF (congestive heart failure) (HCC)    Coronary artery disease    Dysrhythmia    A.fib   GERD (gastroesophageal reflux disease)    History of cardioversion    Hypercholesteremia    Hypertension    Pneumonia    Pre-diabetes    PTSD (post-traumatic stress disorder)    from [place]   S/P cardiac cath 11/18/2013   Discharge Diagnoses:   Principal Problem:   Left knee DJD  Estimated body mass index is 27.15 kg/m as calculated from the following:   Height as of this encounter: 5\' 8"  (1.727 m).   Weight as of this encounter: 81 kg.  Procedure:  Procedure(s) (LRB): ARTHROPLASTY, KNEE, TOTAL (Left)   Consults: None  HPI: see pre-op H&P Laboratory Data: Admission on 10/14/2023  Component Date Value Ref Range Status   WBC 10/15/2023 12.6 (H)  4.0 - 10.5 K/uL Final   RBC 10/15/2023 4.32  4.22 - 5.81 MIL/uL Final   Hemoglobin 10/15/2023 13.7  13.0 - 17.0 g/dL Final   HCT 72/53/6644 42.0  39.0 - 52.0 % Final   MCV 10/15/2023 97.2  80.0 - 100.0 fL Final   MCH 10/15/2023 31.7  26.0 - 34.0 pg Final   MCHC 10/15/2023 32.6  30.0 - 36.0 g/dL Final   RDW 03/47/4259 13.3  11.5 - 15.5 % Final   Platelets 10/15/2023 187  150 - 400 K/uL Final   nRBC 10/15/2023 0.0  0.0 - 0.2 % Final   Performed at [facility], [address] [place], [place] [address]   Sodium 10/15/2023 133 (L)  135 - 145 mmol/L Final   Potassium 10/15/2023 4.6  3.5 - 5.1 mmol/L Final   Chloride 10/15/2023 100  98 - 111 mmol/L Final   CO2 10/15/2023 25  22 - 32 mmol/L Final   Glucose, Bld 10/15/2023 152  (H)  70 - 99 mg/dL Final   Glucose reference range applies only to samples taken after fasting for at least 8 hours.   BUN 10/15/2023 17  8 - 23 mg/dL Final   Creatinine, Ser 10/15/2023 1.08  0.61 - 1.24 mg/dL Final   Calcium 56/43/3295 8.6 (L)  8.9 - 10.3 mg/dL Final   GFR, Estimated 10/15/2023 >60  >60 mL/min Final   Comment: (NOTE) Calculated using the CKD-EPI Creatinine Equation (2021)    Anion gap 10/15/2023 8  5 - 15 Final   Performed at [facility], [address] [place], [place] [address]  Hospital Outpatient Visit on 10/01/2023  Component Date Value Ref Range Status   MRSA, PCR 10/01/2023 POSITIVE (A)  NEGATIVE Final   Comment: RESULT CALLED TO, READ BACK BY AND VERIFIED WITH: NEALS, G. 1346 10/01/23 BY JE    Staphylococcus aureus 10/01/2023 POSITIVE (A)  NEGATIVE Final   Comment: (NOTE) The Xpert SA Assay (FDA approved for NASAL specimens in patients 75 years of age and older), is one component of a comprehensive surveillance program. It is not intended to diagnose infection nor to guide or monitor treatment. Performed at [facility], [address] [place],  [place] [address]    Sodium 10/01/2023 134 (L)  135 - 145 mmol/L Final   Potassium 10/01/2023 4.2  3.5 - 5.1 mmol/L Final   Chloride 10/01/2023 98  98 - 111 mmol/L Final   CO2 10/01/2023 28  22 - 32 mmol/L Final   Glucose, Bld 10/01/2023 90  70 - 99 mg/dL Final   Glucose reference range applies only to samples taken after fasting for at least 8 hours.   BUN 10/01/2023 18  8 - 23 mg/dL Final   Creatinine, Ser 10/01/2023 1.02  0.61 - 1.24 mg/dL Final   Calcium 11/91/4782 8.9  8.9 - 10.3 mg/dL Final   Total Protein 95/62/1308 7.3  6.5 - 8.1 g/dL Final   Albumin 65/78/4696 3.8  3.5 - 5.0 g/dL Final   AST 29/52/8413 19  15 - 41 U/L Final   ALT 10/01/2023 15  0 - 44 U/L Final   Alkaline Phosphatase 10/01/2023 67  38 - 126 U/L Final   Total Bilirubin 10/01/2023 1.4 (H)  0.0 -  1.2 mg/dL Final   GFR, Estimated 10/01/2023 >60  >60 mL/min Final   Comment: (NOTE) Calculated using the CKD-EPI Creatinine Equation (2021)    Anion gap 10/01/2023 8  5 - 15 Final   Performed at [facility], [address], [place], [place] [address]   WBC 10/01/2023 6.0  4.0 - 10.5 K/uL Final   RBC 10/01/2023 5.01  4.22 - 5.81 MIL/uL Final   Hemoglobin 10/01/2023 15.6  13.0 - 17.0 g/dL Final   HCT 02/72/5366 48.4  39.0 - 52.0 % Final   MCV 10/01/2023 96.6  80.0 - 100.0 fL Final   MCH 10/01/2023 31.1  26.0 - 34.0 pg Final   MCHC 10/01/2023 32.2  30.0 - 36.0 g/dL Final   RDW 44/07/4740 13.8  11.5 - 15.5 % Final   Platelets 10/01/2023 197  150 - 400 K/uL Final   nRBC 10/01/2023 0.0  0.0 - 0.2 % Final   Performed at [facility], [address] [place], [place] [address]     X-Rays:DG Knee 1-2 Views Left Result Date: 10/14/2023 CLINICAL DATA:  [id number] S/P TKR (total knee replacement) using cement [phone]. EXAM: LEFT KNEE - 1-2 VIEW COMPARISON:  None Available. FINDINGS: No acute fracture or dislocation. No aggressive osseous lesion. Baseline examination status post left total knee arthroplasty with patellar resurfacing. The hardware is intact. No periprosthetic fracture or lucency. There is near anatomic alignment. No knee effusion. Anterior skin staples and air within this surrounding soft tissue related to surgery. No radiopaque foreign bodies. IMPRESSION: *Baseline examination status post left total knee arthroplasty. No evidence of complications. Electronically Signed   By: Beula Brunswick M.D.   On: 10/14/2023 15:09    EKG: Orders placed or performed during the hospital encounter of 01/04/20   ED EKG   ED EKG   EKG     Hospital Course: Walter Raymond is a 78 y.o. who was admitted to [facility]. They were brought to the operating room on 10/14/2023 and underwent Procedure(s): ARTHROPLASTY, KNEE, TOTAL.  Patient tolerated the procedure well  and was later transferred to the recovery room and then to the orthopaedic floor for postoperative care.  They were given PO and IV analgesics for pain control following their surgery.  They were given 24 hours of postoperative antibiotics of  Anti-infectives (From admission, onward)    Start     Dose/Rate Route Frequency Ordered Stop   10/14/23  2200  vancomycin  (VANCOCIN ) IVPB 1000 mg/200 mL premix        1,000 mg 200 mL/hr over 60 Minutes Intravenous Every 12 hours 10/14/23 1552 10/14/23 2255   10/14/23 1800  ceFAZolin (ANCEF) IVPB 1 g/50 mL premix        1 g 100 mL/hr over 30 Minutes Intravenous Every 6 hours 10/14/23 1342 10/15/23 0949   10/14/23 0930  gentamicin (GARAMYCIN) IVPB 80 mg  Status:  Discontinued        80 mg 200 mL/hr over 30 Minutes Intravenous Every 8 hours 10/14/23 0917 10/14/23 0920   10/14/23 0930  gentamicin (GARAMYCIN) 80 mg in sodium chloride  0.9 % 50 mL injection  Status:  Discontinued        100 mL/hr over 30 Minutes Intravenous  Once 10/14/23 0921 10/14/23 1552   10/14/23 0915  gentamicin (GARAMYCIN) 410 mg in dextrose 5 % 100 mL IVPB  Status:  Discontinued        5 mg/kg  81.2 kg 110.3 mL/hr over 60 Minutes Intravenous On call to O.R. 10/14/23 0902 10/14/23 0910   10/14/23 0915  gentamicin (GARAMYCIN) IVPB 80 mg  Status:  Discontinued        80 mg 100 mL/hr over 30 Minutes Intravenous On call to O.R. 10/14/23 0911 10/14/23 0917   10/14/23 0900  ceFAZolin (ANCEF) IVPB 2g/100 mL premix        2 g 200 mL/hr over 30 Minutes Intravenous On call to O.R. 10/14/23 2130 10/14/23 1148   10/14/23 0900  vancomycin  (VANCOCIN ) IVPB 1000 mg/200 mL premix        1,000 mg 200 mL/hr over 60 Minutes Intravenous On call to O.R. 10/14/23 8657 10/14/23 1708   10/14/23 0900  gentamicin (GARAMYCIN) IVPB 80 mg  Status:  Discontinued        80 mg 100 mL/hr over 30 Minutes Intravenous On call to O.R. 10/14/23 8469 10/14/23 0900      and started on DVT prophylaxis in the form of  Xarelto , TED hose, and SCDs.   PT and OT were ordered for total joint protocol.  Discharge planning consulted to help with postop disposition and equipment needs.  Patient had a good night on the evening of surgery.  They started to get up OOB with therapy on day one. By day one, the patient had progressed with therapy and meeting their goals.  Incision was healing well.  Patient was seen in rounds and was ready to go home.   Diet: Regular diet Activity:WBAT Follow-up:in 10-14 days Disposition - Home Discharged Condition: good   Discharge Instructions     Call MD / Call 911   Complete by: As directed    If you experience chest pain or shortness of breath, CALL 911 and be transported to the hospital emergency room.  If you develope a fever above 101 F, pus (white drainage) or increased drainage or redness at the wound, or calf pain, call your surgeon's office.   Constipation Prevention   Complete by: As directed    Drink plenty of fluids.  Prune juice may be helpful.  You may use a stool softener, such as Colace (over the counter) 100 mg twice a day.  Use MiraLax (over the counter) for constipation as needed.   Diet - low sodium heart healthy   Complete by: As directed    Increase activity slowly as tolerated   Complete by: As directed    Post-operative opioid taper instructions:   Complete by: As  directed    POST-OPERATIVE OPIOID TAPER INSTRUCTIONS: It is important to wean off of your opioid medication as soon as possible. If you do not need pain medication after your surgery it is ok to stop day one. Opioids include: Codeine, Hydrocodone (Norco, Vicodin), Oxycodone (Percocet, oxycontin ) and hydromorphone  amongst others.  Long term and even short term use of opiods can cause: Increased pain response Dependence Constipation Depression Respiratory depression And more.  Withdrawal symptoms can include Flu like symptoms Nausea, vomiting And more Techniques to manage these  symptoms Hydrate well Eat regular healthy meals Stay active Use relaxation techniques(deep breathing, meditating, yoga) Do Not substitute Alcohol  to help with tapering If you have been on opioids for less than two weeks and do not have pain than it is ok to stop all together.  Plan to wean off of opioids This plan should start within one week post op of your joint replacement. Maintain the same interval or time between taking each dose and first decrease the dose.  Cut the total daily intake of opioids by one tablet each day Next start to increase the time between doses. The last dose that should be eliminated is the evening dose.         Allergies as of 10/15/2023       Reactions   Oxycodone  Hives   Penicillins Hives        Medication List     TAKE these medications    citalopram  40 MG tablet Commonly known as: CELEXA  Take 40 mg by mouth daily.   docusate sodium 100 MG capsule Commonly known as: Colace Take 1 capsule (100 mg total) by mouth 2 (two) times daily.   Entresto 24-26 MG Generic drug: sacubitril-valsartan Take 1 tablet by mouth daily.   furosemide  40 MG tablet Commonly known as: LASIX  Take 40 mg by mouth daily.   HYDROmorphone  2 MG tablet Commonly known as: Dilaudid  Take 1 tablet (2 mg total) by mouth every 4 (four) hours as needed for severe pain (pain score 7-10).   methocarbamol 500 MG tablet Commonly known as: ROBAXIN Take 1 tablet (500 mg total) by mouth every 8 (eight) hours as needed for muscle spasms.   nitroGLYCERIN  0.4 MG SL tablet Commonly known as: NITROSTAT  Place 0.4 mg under the tongue every 5 (five) minutes as needed for chest pain.   polyethylene glycol 17 g packet Commonly known as: MIRALAX / GLYCOLAX Take 17 g by mouth daily.   pravastatin 40 MG tablet Commonly known as: PRAVACHOL Take 40 mg by mouth daily.   sotalol  120 MG tablet Commonly known as: BETAPACE  Take 120 mg by mouth 2 (two) times daily.   traZODone 50 MG  tablet Commonly known as: DESYREL Take 50 mg by mouth at bedtime as needed for sleep.   Xarelto  20 MG Tabs tablet Generic drug: rivaroxaban  Take 20 mg by mouth daily.        Follow-up Information     Orvan Blanch, MD. Go on 10/28/2023.   Specialty: Orthopedic Surgery Why: You are scheduled for a post op appointment on Wednesday 10/28/23 at 3:15pm Contact information: [address] [place] [address] [phone]         Alan All.. Go on 10/19/2023.   Why: You are scheduled for a physical therapy appointment Monday 10/19/23 at 10:15am Contact information: [address] [place] [address] [phone]                 Signed: Merriam Abbey, PA-C Orthopaedic Surgery 10/15/2023,  10:26 AM

## 2023-10-15 NOTE — Evaluation (Signed)
 Physical Therapy Evaluation Patient Details Name: Walter Raymond MRN: [id number] DOB: 08/09/1945 Today's Date: 10/15/2023  History of Present Illness  78 yo male presents to therapy s/p L TKA on 10/14/2023 due to failure of conservative measures. Pt PMH includes but is not limited to: A-fib, cardioverter/defibrillator in situ, CHF, CAD s/p cath, GERD, HTN, HLD, and PTSD.  Clinical Impression  Pt s/p L TKR and presents with decreased L LE strength/ROM and post op pain limiting functional mobility.  Pt should progress to dc home with family assist and reports first OP PT scheduled 10/19/23.        If plan is discharge home, recommend the following: A little help with walking and/or transfers;A little help with bathing/dressing/bathroom;Assistance with cooking/housework;Assist for transportation;Help with stairs or ramp for entrance   Can travel by private vehicle        Equipment Recommendations Rolling walker (2 wheels)  Recommendations for Other Services       Functional Status Assessment Patient has had a recent decline in their functional status and demonstrates the ability to make significant improvements in function in a reasonable and predictable amount of time.     Precautions / Restrictions Precautions Precautions: Fall;Knee Recall of Precautions/Restrictions: Intact Restrictions Weight Bearing Restrictions Per Provider Order: Yes LLE Weight Bearing Per Provider Order: Weight bearing as tolerated      Mobility  Bed Mobility Overal bed mobility: Needs Assistance Bed Mobility: Supine to Sit     Supine to sit: Contact guard     General bed mobility comments: for safety    Transfers Overall transfer level: Needs assistance Equipment used: Rolling walker (2 wheels) Transfers: Sit to/from Stand Sit to Stand: Contact guard assist           General transfer comment: Steady assist with cues for LE management and use of UEs to self assist     Ambulation/Gait Ambulation/Gait assistance: Min assist, Contact guard assist Gait Distance (Feet): 95 Feet Assistive device: Rolling walker (2 wheels) Gait Pattern/deviations: Step-to pattern, Decreased step length - right, Decreased step length - left, Shuffle, Trunk flexed Gait velocity: decr     General Gait Details: cues for sequence, posture and position from RW; initial instability with reluctance to WB on L but progressed to [facility]  Stairs            Wheelchair Mobility     Tilt Bed    Modified Rankin (Stroke Patients Only)       Balance Overall balance assessment: Needs assistance Sitting-balance support: Feet supported, No upper extremity supported Sitting balance-Leahy Scale: Good     Standing balance support: Bilateral upper extremity supported Standing balance-Leahy Scale: Poor                               Pertinent Vitals/Pain Pain Assessment Pain Assessment: 0-10 Pain Score: 6  Pain Location: L knee/thigh Pain Descriptors / Indicators: Aching, Sore Pain Intervention(s): Limited activity within patient's tolerance, Monitored during session, Premedicated before session, Ice applied    Home Living Family/patient expects to be discharged to:: Private residence Living Arrangements: Spouse/significant other Available Help at Discharge: Family;Available 24 hours/day Type of Home: House Home Access: Stairs to enter Entrance Stairs-Rails: Right Entrance Stairs-Number of Steps: 2   Home Layout: One level Home Equipment: Cane - single point;Crutches      Prior Function Prior Level of Function : Independent/Modified Independent  Mobility Comments: Use of cane as needed       Extremity/Trunk Assessment   Upper Extremity Assessment Upper Extremity Assessment: Overall WFL for tasks assessed    Lower Extremity Assessment Lower Extremity Assessment: LLE deficits/detail LLE Deficits / Details: 3/5 quads with IND SLR;  AAROM at knee -4 - 105    Cervical / Trunk Assessment Cervical / Trunk Assessment: Normal  Communication   Communication Communication: Impaired Factors Affecting Communication: Hearing impaired    Cognition Arousal: Alert Behavior During Therapy: WFL for tasks assessed/performed   PT - Cognitive impairments: No apparent impairments                         Following commands: Intact       Cueing Cueing Techniques: Verbal cues     General Comments      Exercises Total Joint Exercises Ankle Circles/Pumps: AROM, Both, 15 reps, Supine Quad Sets: AROM, Both, 10 reps, Supine Heel Slides: AAROM, Left, 15 reps, Supine Straight Leg Raises: AAROM, AROM, Left, 15 reps, Supine Long Arc Quad: AAROM, Left, 10 reps, Seated   Assessment/Plan    PT Assessment Patient needs continued PT services  PT Problem List Decreased strength;Decreased range of motion;Decreased activity tolerance;Decreased balance;Decreased mobility;Decreased knowledge of use of DME;Pain       PT Treatment Interventions DME instruction;Gait training;Stair training;Functional mobility training;Therapeutic activities;Therapeutic exercise;Patient/family education    PT Goals (Current goals can be found in the Care Plan section)  Acute Rehab PT Goals Patient Stated Goal: Regain IND PT Goal Formulation: With patient Time For Goal Achievement: 10/22/23 Potential to Achieve Goals: Good    Frequency 7X/week     Co-evaluation               AM-PAC PT "6 Clicks" Mobility  Outcome Measure Help needed turning from your back to your side while in a flat bed without using bedrails?: A Little Help needed moving from lying on your back to sitting on the side of a flat bed without using bedrails?: A Little Help needed moving to and from a bed to a chair (including a wheelchair)?: A Little Help needed standing up from a chair using your arms (e.g., wheelchair or bedside chair)?: A Little Help needed to  walk in hospital room?: A Little Help needed climbing 3-5 steps with a railing? : A Lot 6 Click Score: 17    End of Session Equipment Utilized During Treatment: Gait belt Activity Tolerance: Patient tolerated treatment well Patient left: in chair;with call bell/phone within reach;with chair alarm set Nurse Communication: Mobility status PT Visit Diagnosis: Difficulty in walking, not elsewhere classified (R26.2)    Time: 0822-0850 PT Time Calculation (min) (ACUTE ONLY): 28 min   Charges:   PT Evaluation $PT Eval Low Complexity: 1 Low PT Treatments $Therapeutic Exercise: 8-22 mins PT General Charges $$ ACUTE PT VISIT: 1 Visit         Thedora Finlay PT Acute Rehabilitation Services Pager [phone] Office [phone]   Oval Moralez 10/15/2023, 10:45 AM

## 2023-10-15 NOTE — Progress Notes (Signed)
 Subjective: 1 Day Post-Op Procedure(s) (LRB): ARTHROPLASTY, KNEE, TOTAL (Left) Patient reports pain as 4 on 0-10 scale.   Denies CP or SOB.  Voiding without difficulty. Positive flatus.pain in thigh not much knee pain. No numbness or tingling. No N/V. Feels ready to go home. Objective: Vital signs in last 24 hours: Temp:  [97.5 F (36.4 C)-98.6 F (37 C)] 97.5 F (36.4 C) (06/05 0533) Pulse Rate:  [59-65] 59 (06/05 0533) Resp:  [10-25] 18 (06/05 0533) BP: (102-135)/(64-79) 102/66 (06/05 0533) SpO2:  [89 %-100 %] 99 % (06/05 0533) Arterial Line BP: (117-129)/(47-56) 117/47 (06/04 1445) Weight:  [81 kg] 81 kg (06/04 0922)  Intake/Output from previous day: 06/04 0701 - 06/05 0700 In: 2040 [P.O.:840; I.V.:1000; IV Piggyback:200] Out: 1600 [Urine:1550; Blood:50] Intake/Output this shift: No intake/output data recorded.  Recent Labs    10/15/23 0334  HGB 13.7   Recent Labs    10/15/23 0334  WBC 12.6*  RBC 4.32  HCT 42.0  PLT 187   Recent Labs    10/15/23 0334  NA 133*  K 4.6  CL 100  CO2 25  BUN 17  CREATININE 1.08  GLUCOSE 152*  CALCIUM 8.6*   No results for input(s): "LABPT", "INR" in the last 72 hours.  Neurologically intact ABD soft Neurovascular intact Sensation intact distally Dorsiflexion/Plantar flexion intact Incision: dressing C/D/I Compartment soft No DVT  Assessment/Plan:  1 Day Post-Op Procedure(s) (LRB): ARTHROPLASTY, KNEE, TOTAL (Left) Advance diet Discharge home with outpt PT scheduled at EO  Principal Problem:   Left knee DJD     Patient's anticipated LOS is less than 2 midnights, meeting these requirements: - Younger than 44 - Lives within 1 hour of care - Has a competent adult at home to recover with post-op recover - NO history of  - Chronic pain requiring opiods  - Diabetes  - Coronary Artery Disease  - Heart failure  - Heart attack  - Stroke  - DVT/VTE  - Cardiac arrhythmia  - Respiratory Failure/COPD  - Renal  failure  - Anemia  - Advanced Liver disease     Loel Ring 10/15/2023, @[facility] 

## 2023-10-16 MED ORDER — DEXAMETHASONE SODIUM PHOSPHATE 10 MG/ML IJ SOLN
INTRAMUSCULAR | Status: DC | PRN
  Administered 2023-10-14: 10 mg

## 2023-10-16 MED ORDER — ROPIVACAINE HCL 5 MG/ML IJ SOLN
INTRAMUSCULAR | Status: DC | PRN
  Administered 2023-10-14: 20 mL via PERINEURAL

## 2023-10-16 NOTE — Anesthesia Procedure Notes (Signed)
 Anesthesia Regional Block: Adductor canal block   Pre-Anesthetic Checklist: , timeout performed,  Correct Patient, Correct Site, Correct Laterality,  Correct Procedure, Correct Position, site marked,  Risks and benefits discussed,  Pre-op evaluation,  At surgeon's request and post-op pain management  Laterality: Left  Prep: Maximum Sterile Barrier Precautions used, chloraprep       Needles:  Injection technique: Single-shot  Needle Type: Echogenic Stimulator Needle     Needle Length: 9cm  Needle Gauge: 21     Additional Needles:   Procedures:,,,, ultrasound used (permanent image in chart),,    Narrative:  Start time: 10/14/2023 10:53 AM End time: 10/14/2023 10:56 AM Injection made incrementally with aspirations every 5 mL. Anesthesiologist: Grace Laura, MD

## 2023-10-16 NOTE — Addendum Note (Signed)
 Addendum  created 10/16/23 1911 by Grace Laura, MD   Child order released for a procedure order, Clinical Note Signed, Intraprocedure Blocks edited, Intraprocedure Meds edited, SmartForm saved

## 2023-10-19 DIAGNOSIS — M25562 Pain in left knee: Secondary | ICD-10-CM | POA: Diagnosis not present

## 2023-10-19 DIAGNOSIS — M25662 Stiffness of left knee, not elsewhere classified: Secondary | ICD-10-CM | POA: Diagnosis not present

## 2023-10-21 DIAGNOSIS — M25662 Stiffness of left knee, not elsewhere classified: Secondary | ICD-10-CM | POA: Diagnosis not present

## 2023-10-21 DIAGNOSIS — M25562 Pain in left knee: Secondary | ICD-10-CM | POA: Diagnosis not present

## 2023-10-23 DIAGNOSIS — M25662 Stiffness of left knee, not elsewhere classified: Secondary | ICD-10-CM | POA: Diagnosis not present

## 2023-10-23 DIAGNOSIS — M25562 Pain in left knee: Secondary | ICD-10-CM | POA: Diagnosis not present

## 2023-10-26 DIAGNOSIS — M25562 Pain in left knee: Secondary | ICD-10-CM | POA: Diagnosis not present

## 2023-10-26 DIAGNOSIS — M25662 Stiffness of left knee, not elsewhere classified: Secondary | ICD-10-CM | POA: Diagnosis not present

## 2023-10-28 DIAGNOSIS — M25662 Stiffness of left knee, not elsewhere classified: Secondary | ICD-10-CM | POA: Diagnosis not present

## 2023-10-28 DIAGNOSIS — Z96652 Presence of left artificial knee joint: Secondary | ICD-10-CM | POA: Diagnosis not present

## 2023-10-28 DIAGNOSIS — M25562 Pain in left knee: Secondary | ICD-10-CM | POA: Diagnosis not present

## 2023-10-28 DIAGNOSIS — Z471 Aftercare following joint replacement surgery: Secondary | ICD-10-CM | POA: Diagnosis not present

## 2023-10-30 DIAGNOSIS — M25662 Stiffness of left knee, not elsewhere classified: Secondary | ICD-10-CM | POA: Diagnosis not present

## 2023-10-30 DIAGNOSIS — M25562 Pain in left knee: Secondary | ICD-10-CM | POA: Diagnosis not present

## 2023-11-02 DIAGNOSIS — M25662 Stiffness of left knee, not elsewhere classified: Secondary | ICD-10-CM | POA: Diagnosis not present

## 2023-11-02 DIAGNOSIS — M25562 Pain in left knee: Secondary | ICD-10-CM | POA: Diagnosis not present

## 2023-11-04 DIAGNOSIS — M25562 Pain in left knee: Secondary | ICD-10-CM | POA: Diagnosis not present

## 2023-11-04 DIAGNOSIS — M25662 Stiffness of left knee, not elsewhere classified: Secondary | ICD-10-CM | POA: Diagnosis not present

## 2023-11-09 DIAGNOSIS — M25662 Stiffness of left knee, not elsewhere classified: Secondary | ICD-10-CM | POA: Diagnosis not present

## 2023-11-09 DIAGNOSIS — M25562 Pain in left knee: Secondary | ICD-10-CM | POA: Diagnosis not present

## 2023-11-17 DIAGNOSIS — M25562 Pain in left knee: Secondary | ICD-10-CM | POA: Diagnosis not present

## 2023-11-17 DIAGNOSIS — M25662 Stiffness of left knee, not elsewhere classified: Secondary | ICD-10-CM | POA: Diagnosis not present

## 2023-11-19 DIAGNOSIS — M25562 Pain in left knee: Secondary | ICD-10-CM | POA: Diagnosis not present

## 2023-11-19 DIAGNOSIS — M25662 Stiffness of left knee, not elsewhere classified: Secondary | ICD-10-CM | POA: Diagnosis not present

## 2023-11-21 DIAGNOSIS — Z95810 Presence of automatic (implantable) cardiac defibrillator: Secondary | ICD-10-CM | POA: Diagnosis not present

## 2023-11-21 DIAGNOSIS — I421 Obstructive hypertrophic cardiomyopathy: Secondary | ICD-10-CM | POA: Diagnosis not present

## 2023-12-08 DIAGNOSIS — M25562 Pain in left knee: Secondary | ICD-10-CM | POA: Diagnosis not present

## 2023-12-08 DIAGNOSIS — M25662 Stiffness of left knee, not elsewhere classified: Secondary | ICD-10-CM | POA: Diagnosis not present

## 2024-02-09 DIAGNOSIS — Z23 Encounter for immunization: Secondary | ICD-10-CM | POA: Diagnosis not present

## 2024-02-20 DIAGNOSIS — Z95810 Presence of automatic (implantable) cardiac defibrillator: Secondary | ICD-10-CM | POA: Diagnosis not present

## 2024-02-20 DIAGNOSIS — I421 Obstructive hypertrophic cardiomyopathy: Secondary | ICD-10-CM | POA: Diagnosis not present

## 2024-06-07 ENCOUNTER — Other Ambulatory Visit: Payer: Self-pay

## 2024-06-07 ENCOUNTER — Emergency Department (HOSPITAL_COMMUNITY)
Admission: EM | Admit: 2024-06-07 | Discharge: 2024-06-07 | Disposition: A | Discharge status: Home / Self Care | Payer: Medicare (Managed Care) | Attending: Emergency Medicine | Admitting: Emergency Medicine

## 2024-06-07 ENCOUNTER — Emergency Department (HOSPITAL_COMMUNITY): Payer: Medicare (Managed Care)

## 2024-06-07 ENCOUNTER — Encounter (HOSPITAL_COMMUNITY): Payer: Self-pay | Admitting: *Deleted

## 2024-06-07 DIAGNOSIS — M25521 Pain in right elbow: Secondary | ICD-10-CM | POA: Insufficient documentation

## 2024-06-07 DIAGNOSIS — I509 Heart failure, unspecified: Secondary | ICD-10-CM | POA: Insufficient documentation

## 2024-06-07 DIAGNOSIS — R0602 Shortness of breath: Secondary | ICD-10-CM | POA: Diagnosis present

## 2024-06-07 LAB — BASIC METABOLIC PANEL WITH GFR
Anion gap: 15 (ref 5–15)
BUN: 20 mg/dL (ref 8–23)
CO2: 24 mmol/L (ref 22–32)
Calcium: 8.9 mg/dL (ref 8.9–10.3)
Chloride: 96 mmol/L — ABNORMAL LOW (ref 98–111)
Creatinine, Ser: 0.95 mg/dL (ref 0.61–1.24)
GFR, Estimated: >60 mL/min
Glucose, Bld: 91 mg/dL (ref 70–99)
Potassium: 3.8 mmol/L (ref 3.5–5.1)
Sodium: 135 mmol/L (ref 135–145)

## 2024-06-07 LAB — URINALYSIS, ROUTINE W REFLEX MICROSCOPIC
Bacteria, UA: NONE SEEN
Bilirubin Urine: NEGATIVE
Glucose, UA: NEGATIVE mg/dL
Ketones, ur: NEGATIVE mg/dL
Leukocytes,Ua: NEGATIVE
Nitrite: NEGATIVE
Protein, ur: NEGATIVE mg/dL
Specific Gravity, Urine: 1.018 (ref 1.005–1.030)
pH: 5 (ref 5.0–8.0)

## 2024-06-07 LAB — CBC
HCT: 45.5 % (ref 39.0–52.0)
Hemoglobin: 15 g/dL (ref 13.0–17.0)
MCH: 31.1 pg (ref 26.0–34.0)
MCHC: 33 g/dL (ref 30.0–36.0)
MCV: 94.4 fL (ref 80.0–100.0)
Platelets: 247 10*3/uL (ref 150–400)
RBC: 4.82 MIL/uL (ref 4.22–5.81)
RDW: 13.8 % (ref 11.5–15.5)
WBC: 9.6 10*3/uL (ref 4.0–10.5)
nRBC: 0 % (ref 0.0–0.2)

## 2024-06-07 LAB — PRO BRAIN NATRIURETIC PEPTIDE: Pro Brain Natriuretic Peptide: 2581 pg/mL — ABNORMAL HIGH

## 2024-06-07 LAB — TROPONIN T, HIGH SENSITIVITY
Troponin T High Sensitivity: 16 ng/L (ref 0–19)
Troponin T High Sensitivity: 15 ng/L (ref 0–19)

## 2024-06-07 MED ORDER — FUROSEMIDE 10 MG/ML IJ SOLN
40.0000 mg | Freq: Once | INTRAMUSCULAR | Status: DC
  Filled 2024-06-07: qty 4

## 2024-06-07 MED ORDER — HYDROMORPHONE HCL 2 MG PO TABS
2.0000 mg | ORAL_TABLET | Freq: Once | ORAL | Status: AC
  Administered 2024-06-07: 2 mg via ORAL
  Filled 2024-06-07: qty 1

## 2024-06-07 MED ORDER — HYDROMORPHONE HCL 1 MG/ML IJ SOLN
0.5000 mg | Freq: Once | INTRAMUSCULAR | Status: DC
  Filled 2024-06-07: qty 0.5

## 2024-06-07 MED ORDER — POTASSIUM CHLORIDE CRYS ER 20 MEQ PO TBCR
40.0000 meq | EXTENDED_RELEASE_TABLET | Freq: Once | ORAL | Status: AC
  Administered 2024-06-07: 40 meq via ORAL
  Filled 2024-06-07: qty 2

## 2024-06-07 NOTE — ED Triage Notes (Signed)
 Pt sent from [facility] for SOB. Pt denies swelling. SOB started on Friday.

## 2024-06-07 NOTE — Discharge Instructions (Signed)
 Continue with doubling up on your fluid pills.  Follow-up with your cardiologist.  Return to the emergency department if any worsening or concerning symptoms.

## 2024-06-07 NOTE — ED Provider Notes (Signed)
 " [place] EMERGENCY DEPARTMENT AT [facility] Provider Note   CSN: [id number] Arrival date & time: 06/07/24  1534     Patient presents with: Shortness of Breath   Walter Raymond is a 79 y.o. male.  He has a history of A-fib on blood thinners, CHF, AICD.  Complaining of increased shortness of breath for the last 4 days.  Cough productive of some clear sputum, although has had a few episodes with some blood.  Feels some tightness in his chest, rates it as 8 out of 10.  Also has right elbow pain which is worse with movement.  Patient has had the elbow pain before, no recent trauma.  He went to his primary care doctor who recommended he come here for further evaluation.  Has an appointment with his cardiologist in 3 days, follows with [facility].  {Add pertinent medical, surgical, social history, OB history to YEP:[id number]} The history is provided by the patient.  Shortness of Breath Severity:  Moderate Onset quality:  Gradual Duration:  4 days Timing:  Constant Progression:  Worsening Chronicity:  New Relieved by:  Nothing Worsened by:  Activity Ineffective treatments:  Rest Associated symptoms: chest pain, cough, hemoptysis and sputum production   Associated symptoms: no abdominal pain, no fever, no syncope and no vomiting   Risk factors: no tobacco use        Prior to Admission medications  Medication Sig Start Date End Date Taking? Authorizing Provider  citalopram  (CELEXA ) 40 MG tablet Take 40 mg by mouth daily. 04/15/19   [provider]  docusate sodium  (COLACE) 100 MG capsule Take 1 capsule (100 mg total) by mouth 2 (two) times daily. 10/15/23 10/14/24  Bissell, Jaclyn M, PA-C  ENTRESTO  24-26 MG Take 1 tablet by mouth daily. 09/02/23   [provider]  furosemide  (LASIX ) 40 MG tablet Take 40 mg by mouth daily. 12/15/16   [provider]  HYDROmorphone  (DILAUDID ) 2 MG tablet Take 1 tablet (2 mg total) by mouth every 4 (four) hours as needed for severe  pain (pain score 7-10). 10/15/23   Bissell, Jaclyn M, PA-C  methocarbamol  (ROBAXIN ) 500 MG tablet Take 1 tablet (500 mg total) by mouth every 8 (eight) hours as needed for muscle spasms. 10/15/23   Bissell, Jaclyn M, PA-C  nitroGLYCERIN  (NITROSTAT ) 0.4 MG SL tablet Place 0.4 mg under the tongue every 5 (five) minutes as needed for chest pain. 08/24/16   [provider]  polyethylene glycol (MIRALAX  / GLYCOLAX ) 17 g packet Take 17 g by mouth daily. 10/15/23   Bissell, Jaclyn M, PA-C  pravastatin  (PRAVACHOL ) 40 MG tablet Take 40 mg by mouth daily. 02/18/17   [provider]  sotalol  (BETAPACE ) 120 MG tablet Take 120 mg by mouth 2 (two) times daily. 01/28/17   [provider]  traZODone  (DESYREL ) 50 MG tablet Take 50 mg by mouth at bedtime as needed for sleep. 01/18/15   [provider]  XARELTO  20 MG TABS tablet Take 20 mg by mouth daily. 04/15/19   [provider]    Allergies: Oxycodone  and Penicillins    Review of Systems  Constitutional:  Negative for fever.  Respiratory:  Positive for cough, hemoptysis, sputum production and shortness of breath.   Cardiovascular:  Positive for chest pain. Negative for syncope.  Gastrointestinal:  Negative for abdominal pain and vomiting.    Updated Vital Signs BP 129/75   Pulse 61   Temp 98.8 F (37.1 C)   Resp 18  Ht 5' 8 (1.727 m)   Wt 80.7 kg   SpO2 98%   BMI 27.06 kg/m   Physical Exam Vitals and nursing note reviewed.  Constitutional:      General: He is not in acute distress.    Appearance: He is well-developed.  HENT:     Head: Normocephalic and atraumatic.  Eyes:     Conjunctiva/sclera: Conjunctivae normal.  Cardiovascular:     Rate and Rhythm: Normal rate and regular rhythm.     Heart sounds: No murmur heard. Pulmonary:     Effort: Accessory muscle usage present. No respiratory distress.     Breath sounds: Normal breath sounds.  Abdominal:     Palpations: Abdomen is soft.     Tenderness:  There is no abdominal tenderness. There is no guarding or rebound.  Musculoskeletal:        General: Tenderness present.     Cervical back: Neck supple.     Right lower leg: No tenderness. No edema.     Left lower leg: No tenderness. No edema.     Comments: He has diffuse tenderness around his right elbow.  There is some swelling at the olecranon.  No significant erythema or warmth.  Skin:    General: Skin is warm and dry.     Capillary Refill: Capillary refill takes less than 2 seconds.  Neurological:     General: No focal deficit present.     Mental Status: He is alert.     Motor: No weakness.     (all labs ordered are listed, but only abnormal results are displayed) Labs Reviewed  BASIC METABOLIC PANEL WITH GFR - Abnormal; Notable for the following components:      Result Value   Chloride 96 (*)    All other components within normal limits  PRO BRAIN NATRIURETIC PEPTIDE - Abnormal; Notable for the following components:   Pro Brain Natriuretic Peptide 2,581.0 (*)    All other components within normal limits  URINALYSIS, ROUTINE W REFLEX MICROSCOPIC - Abnormal; Notable for the following components:   Hgb urine dipstick SMALL (*)    All other components within normal limits  CBC  TROPONIN T, HIGH SENSITIVITY  TROPONIN T, HIGH SENSITIVITY    EKG: EKG Interpretation Date/Time:  Tuesday June 07 2024 15:42:40 EST Ventricular Rate:  70 PR Interval:  164 QRS Duration:  154 QT Interval:  530 QTC Calculation: 572 R Axis:   210  Text Interpretation: AV dual-paced rhythm with frequent ventricular-paced complexes Biventricular pacemaker detected Abnormal ECG When compared with ECG of 04-Jan-2020 13:55, Vent. rate has increased BY   5 BPM Confirmed by Towana Sharper [phone]) on 06/07/2024 4:16:18 PM  Radiology: DG Chest 2 View Result Date: 06/07/2024 EXAM: 2 VIEW(S) XRAY OF THE CHEST 06/07/2024 04:13:36 PM COMPARISON: None available. CLINICAL HISTORY: Shortness of breath and  chest pain. FINDINGS: LINES, TUBES AND DEVICES: Left chest cardiac pacemaker and defibrillator noted. LUNGS AND PLEURA: Linear scarring or subsegmental atelectasis left lung base. No pleural effusion. No pneumothorax. HEART AND MEDIASTINUM: Left chest cardiac pacemaker and defibrillator noted. No acute abnormality of the cardiac and mediastinal silhouettes. BONES AND SOFT TISSUES: Thoracic degenerative changes. IMPRESSION: 1. No acute findings. 2. Linear scarring or subsegmental atelectasis left lung base. Electronically signed by: Elsie Gravely MD 06/07/2024 04:40 PM EST RP Workstation: [id number]    {Document cardiac monitor, telemetry assessment procedure when appropriate:32947} Procedures   Medications Ordered in the ED  furosemide  (LASIX ) injection 40 mg (has no administration in  time range)  potassium chloride  SA (KLOR-CON  M) CR tablet 40 mEq (has no administration in time range)      {Click here for ABCD2, HEART and other calculators REFRESH Note before signing:1}                              Medical Decision Making Amount and/or Complexity of Data Reviewed Labs: ordered. Radiology: ordered.  Risk Prescription drug management.   This patient complains of ***; this involves an extensive number of treatment Options and is a complaint that carries with it a high risk of complications and morbidity. The differential includes ***  I ordered, reviewed and interpreted labs, which included *** I ordered medication *** and reviewed PMP when indicated. I ordered imaging studies which included *** and I independently    visualized and interpreted imaging which showed *** Additional history obtained from *** Previous records obtained and reviewed *** I consulted *** and discussed lab and imaging findings and discussed disposition.  Cardiac monitoring reviewed, *** Social determinants considered, *** Critical Interventions: ***  After the interventions stated above, I reevaluated  the patient and found *** Admission and further testing considered, ***   {Document critical care time when appropriate  Document review of labs and clinical decision tools ie CHADS2VASC2, etc  Document your independent review of radiology images and any outside records  Document your discussion with family members, caretakers and with consultants  Document social determinants of health affecting pt's care  Document your decision making why or why not admission, treatments were needed:32947:::1}   Final diagnoses:  None    ED Discharge Orders     None        "
